# Patient Record
Sex: Female | Born: 1960
Health system: Southern US, Community
[De-identification: ages and names within clinical notes are randomized; demographics above are authoritative.]

## PROBLEM LIST (undated history)

## (undated) DIAGNOSIS — Z8679 Personal history of other diseases of the circulatory system: Secondary | ICD-10-CM

## (undated) DIAGNOSIS — I10 Essential (primary) hypertension: Secondary | ICD-10-CM

## (undated) DIAGNOSIS — E079 Disorder of thyroid, unspecified: Secondary | ICD-10-CM

## (undated) DIAGNOSIS — R001 Bradycardia, unspecified: Secondary | ICD-10-CM

## (undated) DIAGNOSIS — E785 Hyperlipidemia, unspecified: Secondary | ICD-10-CM

## (undated) DIAGNOSIS — K219 Gastro-esophageal reflux disease without esophagitis: Secondary | ICD-10-CM

---

## 2009-01-17 ENCOUNTER — Emergency Department (HOSPITAL_BASED_OUTPATIENT_CLINIC_OR_DEPARTMENT_OTHER): Admission: EM | Admit: 2009-01-17 | Discharge: 2009-01-17 | Payer: Self-pay | Admitting: Emergency Medicine

## 2010-10-02 ENCOUNTER — Encounter: Payer: Self-pay | Admitting: *Deleted

## 2010-10-02 ENCOUNTER — Emergency Department (INDEPENDENT_AMBULATORY_CARE_PROVIDER_SITE_OTHER): Payer: Medicaid Other

## 2010-10-02 ENCOUNTER — Emergency Department (HOSPITAL_BASED_OUTPATIENT_CLINIC_OR_DEPARTMENT_OTHER)
Admission: EM | Admit: 2010-10-02 | Discharge: 2010-10-02 | Disposition: A | Discharge status: Other Acute Inpatient Hospital | Payer: Medicaid Other | Attending: Emergency Medicine | Admitting: Emergency Medicine

## 2010-10-02 ENCOUNTER — Other Ambulatory Visit: Payer: Self-pay

## 2010-10-02 DIAGNOSIS — E079 Disorder of thyroid, unspecified: Secondary | ICD-10-CM | POA: Insufficient documentation

## 2010-10-02 DIAGNOSIS — R079 Chest pain, unspecified: Secondary | ICD-10-CM | POA: Insufficient documentation

## 2010-10-02 HISTORY — DX: Disorder of thyroid, unspecified: E07.9

## 2010-10-02 LAB — BASIC METABOLIC PANEL
CO2: 26 mEq/L (ref 19–32)
Glucose, Bld: 107 mg/dL — ABNORMAL HIGH (ref 70–99)
Potassium: 3.3 mEq/L — ABNORMAL LOW (ref 3.5–5.1)
Sodium: 141 mEq/L (ref 135–145)

## 2010-10-02 LAB — CBC
Hemoglobin: 11.1 g/dL — ABNORMAL LOW (ref 12.0–15.0)
MCH: 25.7 pg — ABNORMAL LOW (ref 26.0–34.0)
RBC: 4.32 MIL/uL (ref 3.87–5.11)

## 2010-10-02 MED ORDER — NITROGLYCERIN 2 % TD OINT
1.0000 [in_us] | TOPICAL_OINTMENT | Freq: Once | TRANSDERMAL | Status: AC
  Administered 2010-10-02: 1 [in_us] via TOPICAL
  Filled 2010-10-02: qty 1

## 2010-10-02 NOTE — ED Notes (Signed)
SR on monitor, resps even and unlabored, IV site unremarkable. Pt and family updated on plan of care.

## 2010-10-02 NOTE — ED Notes (Signed)
Pt c/o chest pain and tightness x 1 hour now without N/V. Pain is worse with movement.

## 2010-10-02 NOTE — ED Notes (Signed)
Spoke at length with pt's daughter regarding plan of care, transfer options, and delays. Family and pt continue to agree with current plan of care. Remains SR/SB on monitor, denies CP or SOB. IV site unremarkable.

## 2010-10-02 NOTE — ED Notes (Signed)
pts iv actually remains intact at time of transport however due to charting system must chart removal to avoid the appearance of the LDA remaining at next patients visit

## 2010-10-02 NOTE — ED Notes (Signed)
Call placed to [facility] for pt admission, no beds available at this time, pt will be placed on a hold list. MD aware.

## 2010-10-02 NOTE — ED Notes (Signed)
Report given to emily watkins rn with [facility] states will be here in 10-15 min to transport pt to [facility] room 423

## 2010-10-02 NOTE — ED Notes (Signed)
Report given to rn in icu at [facility]

## 2010-10-02 NOTE — ED Provider Notes (Signed)
History     CSN: [id number] Arrival date & time: 10/02/2010  2:57 AM  Chief Complaint  Patient presents with  . Chest Pain   Patient is a 50 y.o. female presenting with chest pain.  Chest Pain The chest pain began 1 - 2 hours ago. Duration of episode(s) is 2 hours. Chest pain occurs constantly. The chest pain is resolved. Associated with: nothing. The pain is currently at 0/10. The severity of the pain is moderate. The quality of the pain is described as pressure-like. The pain does not radiate. Chest pain is worsened by certain positions. Pertinent negatives for primary symptoms include no fever, no syncope, no shortness of breath, no cough, no wheezing, no abdominal pain and no nausea.  Pertinent negatives for associated symptoms include no diaphoresis. She tried nitroglycerin (resolved pain in route with EMS) for the symptoms. Risk factors include no known risk factors (FA had MI in his 40s).  Her family medical history is significant for CAD in family.    L sided pain - had ASA PTA  Past Medical History  Diagnosis Date  . Thyroid disease     History reviewed. No pertinent past surgical history.  History reviewed. No pertinent family history.  History  Substance Use Topics  . Smoking status: Not on file  . Smokeless tobacco: Not on file  . Alcohol Use: No    OB History    Grav Para Term Preterm Abortions TAB SAB Ect Mult Living                  Review of Systems  Constitutional: Negative for fever, chills and diaphoresis.  HENT: Negative for neck pain and neck stiffness.   Eyes: Negative for pain.  Respiratory: Negative for cough, shortness of breath and wheezing.   Cardiovascular: Positive for chest pain. Negative for syncope.  Gastrointestinal: Negative for nausea and abdominal pain.  Genitourinary: Negative for dysuria.  Musculoskeletal: Negative for back pain.  Skin: Negative for rash.  Neurological: Negative for headaches.  All other systems reviewed and are  negative.    Physical Exam  BP 153/88  Pulse 59  Temp(Src) 98.2 F (36.8 C) (Oral)  Resp 16  SpO2 100%  Physical Exam  Constitutional: She is oriented to person, place, and time. She appears well-developed and well-nourished.  HENT:  Head: Normocephalic and atraumatic.  Eyes: Conjunctivae and EOM are normal. Pupils are equal, round, and reactive to light.  Neck: Trachea normal. Neck supple. No thyromegaly present.  Cardiovascular: Normal rate, regular rhythm, S1 normal, S2 normal and normal pulses.     No systolic murmur is present   No diastolic murmur is present  Pulses:      Radial pulses are 2+ on the right side, and 2+ on the left side.  Pulmonary/Chest: Effort normal and breath sounds normal. She has no wheezes. She has no rhonchi. She has no rales. She exhibits no tenderness.  Abdominal: Soft. Normal appearance and bowel sounds are normal. There is no tenderness. There is no CVA tenderness and negative Murphy's sign.  Musculoskeletal:       BLE:s Calves nontender, no cords or erythema, negative Homans sign  Neurological: She is alert and oriented to person, place, and time. She has normal strength. No cranial nerve deficit or sensory deficit. GCS eye subscore is 4. GCS verbal subscore is 5. GCS motor subscore is 6.  Skin: Skin is warm and dry. No rash noted. She is not diaphoretic.  Psychiatric: Her speech is normal.  Cooperative and appropriate    ED Course  Procedures   Date: 10/02/2010  Rate: 58  Rhythm: normal sinus rhythm  QRS Axis: normal  Intervals: normal  ST/T Wave abnormalities: nonspecific ST changes  Conduction Disutrbances:none  Narrative Interpretation:   Old EKG Reviewed: none available    MDM  CP low risk for ACS, no symptoms of DVT and doubt PE/ dissection. Screening ECG reviewed. NTG paste applied and CXR and labs obtained/ reviewed. remians pain free in ED. MED c/s for admit. D/w DR Pearson Grippe who accepts in Tx for admit at [facility] at 4:57  AM   Dg Chest 2 View  10/02/2010  *RADIOLOGY REPORT*  Clinical Data: Chest pain  CHEST - 2 VIEW  Comparison: None.  Findings: Mild central vascular congestion without overt edema. Heart size upper normal limits to mildly enlarged.  No pleural effusion or pneumothorax.  No focal consolidation.  No acute osseous abnormality.  IMPRESSION: Hear size upper normal to mildly enlarged. No focal consolidation.  Original Report Authenticated By: Waneta Martins, M.D.   Results for orders placed during the hospital encounter of 10/02/10 (from the past 24 hour(s))  CBC     Status: Abnormal   Collection Time   10/02/10  2:52 AM      Component Value Range   WBC 3.8 (*) 4.0 - 10.5 (K/uL)   RBC 4.32  3.87 - 5.11 (MIL/uL)   Hemoglobin 11.1 (*) 12.0 - 15.0 (g/dL)   HCT 21.3 (*) 08.6 - 46.0 (%)   MCV 77.3 (*) 78.0 - 100.0 (fL)   MCH 25.7 (*) 26.0 - 34.0 (pg)   MCHC 33.2  30.0 - 36.0 (g/dL)   RDW 57.8  46.9 - 62.9 (%)   Platelets 293  150 - 400 (K/uL)  BASIC METABOLIC PANEL     Status: Abnormal   Collection Time   10/02/10  2:52 AM      Component Value Range   Sodium 141  135 - 145 (mEq/L)   Potassium 3.3 (*) 3.5 - 5.1 (mEq/L)   Chloride 105  96 - 112 (mEq/L)   CO2 26  19 - 32 (mEq/L)   Glucose, Bld 107 (*) 70 - 99 (mg/dL)   BUN 13  6 - 23 (mg/dL)   Creatinine, Ser 5.28  0.50 - 1.10 (mg/dL)   Calcium 8.8  8.4 - 41.3 (mg/dL)   GFR calc non Af Amer >60  >60 (mL/min)   GFR calc Af Amer >60  >60 (mL/min)  CARDIAC PANEL(CRET KIN+CKTOT+MB+TROPI)     Status: Abnormal   Collection Time   10/02/10  2:57 AM      Component Value Range   Total CK 672 (*) 7 - 177 (U/L)   CK, MB 8.2 (*) 0.3 - 4.0 (ng/mL)   Troponin I <0.30  <0.30 (ng/mL)   Relative Index 1.2  0.0 - 2.5          Sunnie Nielsen, MD 10/02/10 [phone]

## 2010-10-02 NOTE — ED Notes (Signed)
MD at bedside. 

## 2010-10-02 NOTE — ED Notes (Signed)
Pt c/o chest tightness that was keeping her from going to sleep. Pain was worse with movement, denies any N/V, no SOB, no cold or cough. Pt denies any diaphoresis. Pt is pain free at this time.

## 2010-10-02 NOTE — ED Notes (Signed)
Pt informed that she had been accepted at [facility] and was still waiting for bed assignment. Pt agrees with plan of care.

## 2010-10-16 LAB — CARDIAC PANEL(CRET KIN+CKTOT+MB+TROPI)
CK, MB: 8.2 ng/mL (ref 0.3–4.0)
Relative Index: 1.2 (ref 0.0–2.5)
Troponin I: <0.3 ng/mL (ref <0.30)

## 2013-09-03 ENCOUNTER — Inpatient Hospital Stay (HOSPITAL_BASED_OUTPATIENT_CLINIC_OR_DEPARTMENT_OTHER)
Admission: EM | Admit: 2013-09-03 | Discharge: 2013-09-04 | DRG: 392 | Disposition: A | Discharge status: Home / Self Care | Payer: Medicaid Other | Attending: Internal Medicine | Admitting: Internal Medicine

## 2013-09-03 ENCOUNTER — Emergency Department (HOSPITAL_BASED_OUTPATIENT_CLINIC_OR_DEPARTMENT_OTHER): Payer: Medicaid Other

## 2013-09-03 ENCOUNTER — Encounter (HOSPITAL_BASED_OUTPATIENT_CLINIC_OR_DEPARTMENT_OTHER): Payer: Self-pay | Admitting: Emergency Medicine

## 2013-09-03 DIAGNOSIS — I10 Essential (primary) hypertension: Secondary | ICD-10-CM | POA: Diagnosis present

## 2013-09-03 DIAGNOSIS — K219 Gastro-esophageal reflux disease without esophagitis: Principal | ICD-10-CM | POA: Diagnosis present

## 2013-09-03 DIAGNOSIS — E876 Hypokalemia: Secondary | ICD-10-CM | POA: Diagnosis present

## 2013-09-03 DIAGNOSIS — R001 Bradycardia, unspecified: Secondary | ICD-10-CM | POA: Diagnosis present

## 2013-09-03 DIAGNOSIS — I498 Other specified cardiac arrhythmias: Secondary | ICD-10-CM | POA: Diagnosis present

## 2013-09-03 DIAGNOSIS — I369 Nonrheumatic tricuspid valve disorder, unspecified: Secondary | ICD-10-CM

## 2013-09-03 DIAGNOSIS — R079 Chest pain, unspecified: Secondary | ICD-10-CM

## 2013-09-03 DIAGNOSIS — E785 Hyperlipidemia, unspecified: Secondary | ICD-10-CM | POA: Diagnosis present

## 2013-09-03 DIAGNOSIS — R0789 Other chest pain: Secondary | ICD-10-CM | POA: Diagnosis present

## 2013-09-03 DIAGNOSIS — E039 Hypothyroidism, unspecified: Secondary | ICD-10-CM | POA: Diagnosis present

## 2013-09-03 DIAGNOSIS — R0602 Shortness of breath: Secondary | ICD-10-CM | POA: Diagnosis present

## 2013-09-03 DIAGNOSIS — Z8679 Personal history of other diseases of the circulatory system: Secondary | ICD-10-CM

## 2013-09-03 DIAGNOSIS — Z8249 Family history of ischemic heart disease and other diseases of the circulatory system: Secondary | ICD-10-CM | POA: Diagnosis not present

## 2013-09-03 DIAGNOSIS — E079 Disorder of thyroid, unspecified: Secondary | ICD-10-CM | POA: Diagnosis present

## 2013-09-03 HISTORY — DX: Hyperlipidemia, unspecified: E78.5

## 2013-09-03 HISTORY — DX: Bradycardia, unspecified: R00.1

## 2013-09-03 HISTORY — DX: Personal history of other diseases of the circulatory system: Z86.79

## 2013-09-03 HISTORY — DX: Essential (primary) hypertension: I10

## 2013-09-03 HISTORY — DX: Gastro-esophageal reflux disease without esophagitis: K21.9

## 2013-09-03 LAB — D-DIMER, QUANTITATIVE: D-Dimer, Quant: <0.27 ug/mL-FEU (ref 0.00–0.48)

## 2013-09-03 LAB — CBC WITH DIFFERENTIAL/PLATELET
BASOS ABS: 0 10*3/uL (ref 0.0–0.1)
BASOS PCT: 0 % (ref 0–1)
EOS ABS: 0.1 10*3/uL (ref 0.0–0.7)
EOS PCT: 2 % (ref 0–5)
HEMATOCRIT: 35.9 % — AB (ref 36.0–46.0)
HEMOGLOBIN: 11.7 g/dL — AB (ref 12.0–15.0)
Lymphocytes Relative: 29 % (ref 12–46)
Lymphs Abs: 1.2 10*3/uL (ref 0.7–4.0)
MCH: 25.5 pg — AB (ref 26.0–34.0)
MCHC: 32.6 g/dL (ref 30.0–36.0)
MCV: 78.4 fL (ref 78.0–100.0)
MONO ABS: 0.4 10*3/uL (ref 0.1–1.0)
MONOS PCT: 10 % (ref 3–12)
NEUTROS ABS: 2.4 10*3/uL (ref 1.7–7.7)
Neutrophils Relative %: 59 % (ref 43–77)
Platelets: 293 10*3/uL (ref 150–400)
RBC: 4.58 MIL/uL (ref 3.87–5.11)
RDW: 14.8 % (ref 11.5–15.5)
WBC: 4 10*3/uL (ref 4.0–10.5)

## 2013-09-03 LAB — BASIC METABOLIC PANEL
Anion gap: 12 (ref 5–15)
BUN: 12 mg/dL (ref 6–23)
CALCIUM: 9.4 mg/dL (ref 8.4–10.5)
CHLORIDE: 105 meq/L (ref 96–112)
CO2: 26 mEq/L (ref 19–32)
CREATININE: 0.9 mg/dL (ref 0.50–1.10)
GFR calc non Af Amer: 71 mL/min — ABNORMAL LOW (ref >90)
GFR, EST AFRICAN AMERICAN: 82 mL/min — AB (ref >90)
Glucose, Bld: 109 mg/dL — ABNORMAL HIGH (ref 70–99)
Potassium: 3.6 mEq/L — ABNORMAL LOW (ref 3.7–5.3)
Sodium: 143 mEq/L (ref 137–147)

## 2013-09-03 LAB — TSH: TSH: 2.51 u[IU]/mL (ref 0.350–4.500)

## 2013-09-03 LAB — TROPONIN I
Troponin I: <0.3 ng/mL (ref <0.30)
Troponin I: <0.3 ng/mL (ref <0.30)

## 2013-09-03 LAB — LIPID PANEL
CHOLESTEROL: 218 mg/dL — AB (ref 0–200)
HDL: 68 mg/dL (ref >39)
LDL Cholesterol: 139 mg/dL — ABNORMAL HIGH (ref 0–99)
TRIGLYCERIDES: 54 mg/dL (ref <150)
Total CHOL/HDL Ratio: 3.2 RATIO
VLDL: 11 mg/dL (ref 0–40)

## 2013-09-03 LAB — HEMOGLOBIN A1C
Hgb A1c MFr Bld: 6.5 % — ABNORMAL HIGH (ref <5.7)
Mean Plasma Glucose: 140 mg/dL — ABNORMAL HIGH (ref <117)

## 2013-09-03 MED ORDER — PANTOPRAZOLE SODIUM 40 MG PO TBEC
40.0000 mg | DELAYED_RELEASE_TABLET | Freq: Every day | ORAL | Status: DC
Start: 2013-09-03 — End: 2013-09-04
  Administered 2013-09-03 – 2013-09-04 (×2): 40 mg via ORAL
  Filled 2013-09-03 (×2): qty 1

## 2013-09-03 MED ORDER — POTASSIUM CHLORIDE CRYS ER 20 MEQ PO TBCR
40.0000 meq | EXTENDED_RELEASE_TABLET | Freq: Once | ORAL | Status: AC
  Administered 2013-09-03: 40 meq via ORAL
  Filled 2013-09-03: qty 2

## 2013-09-03 MED ORDER — ACETAMINOPHEN 650 MG RE SUPP: 650.0000 mg | Freq: Four times a day (QID) | RECTAL | Status: DC | PRN

## 2013-09-03 MED ORDER — ASPIRIN 81 MG PO CHEW
324.0000 mg | CHEWABLE_TABLET | Freq: Once | ORAL | Status: AC
  Administered 2013-09-03: 324 mg via ORAL
  Filled 2013-09-03: qty 4

## 2013-09-03 MED ORDER — LEVOTHYROXINE SODIUM 150 MCG PO TABS
150.0000 ug | ORAL_TABLET | Freq: Every day | ORAL | Status: DC
  Filled 2013-09-03 (×2): qty 1

## 2013-09-03 MED ORDER — ENOXAPARIN SODIUM 40 MG/0.4ML ~~LOC~~ SOLN
40.0000 mg | SUBCUTANEOUS | Status: DC
  Filled 2013-09-03: qty 0.4

## 2013-09-03 MED ORDER — AMLODIPINE BESYLATE 5 MG PO TABS
5.0000 mg | ORAL_TABLET | Freq: Every day | ORAL | Status: DC
  Filled 2013-09-03: qty 1

## 2013-09-03 MED ORDER — ONDANSETRON HCL 4 MG PO TABS: 4.0000 mg | ORAL_TABLET | Freq: Four times a day (QID) | ORAL | Status: DC | PRN

## 2013-09-03 MED ORDER — ACETAMINOPHEN 325 MG PO TABS: 650.0000 mg | ORAL_TABLET | Freq: Four times a day (QID) | ORAL | Status: DC | PRN

## 2013-09-03 MED ORDER — PANTOPRAZOLE SODIUM 40 MG PO TBEC: 40.0000 mg | DELAYED_RELEASE_TABLET | Freq: Every day | ORAL | Status: DC

## 2013-09-03 MED ORDER — ENOXAPARIN SODIUM 40 MG/0.4ML ~~LOC~~ SOLN
40.0000 mg | Freq: Every day | SUBCUTANEOUS | Status: DC
  Administered 2013-09-03: 40 mg via SUBCUTANEOUS
  Filled 2013-09-03 (×2): qty 0.4

## 2013-09-03 MED ORDER — ASPIRIN 81 MG PO CHEW
324.0000 mg | CHEWABLE_TABLET | Freq: Every day | ORAL | Status: DC
  Administered 2013-09-04: 324 mg via ORAL
  Filled 2013-09-03: qty 4

## 2013-09-03 MED ORDER — AMLODIPINE BESYLATE 5 MG PO TABS
5.0000 mg | ORAL_TABLET | Freq: Every day | ORAL | Status: DC
Start: 2013-09-03 — End: 2013-09-04
  Administered 2013-09-03: 5 mg via ORAL
  Filled 2013-09-03 (×2): qty 1

## 2013-09-03 MED ORDER — ONDANSETRON HCL 4 MG/2ML IJ SOLN: 4.0000 mg | Freq: Four times a day (QID) | INTRAMUSCULAR | Status: DC | PRN

## 2013-09-03 MED ORDER — LEVOTHYROXINE SODIUM 150 MCG PO TABS
150.0000 ug | ORAL_TABLET | Freq: Every day | ORAL | Status: DC
  Administered 2013-09-03: 150 ug via ORAL
  Filled 2013-09-03 (×2): qty 1

## 2013-09-03 NOTE — Progress Notes (Signed)
UR Completed Farmer Mccahill Graves-Bigelow, RN,BSN [phone]  

## 2013-09-03 NOTE — ED Notes (Signed)
Patient states she developed heart burn yesterday around 1630, which became a sharp pain with coughing.  States this lasted on and off for approximately two hours.  States she went to work and had no additional pain.  States at 0700 this morning she got off of work and went outside to work in the yard.  States she had been working approximately 45 minutes the pain returned, felt like heartburn, then progressed to worse pain and was associated with sob.

## 2013-09-03 NOTE — Progress Notes (Signed)
  Echocardiogram 2D Echocardiogram has been performed.  Miranda Washington, Miranda Washington 09/03/2013, 4:25 PM

## 2013-09-03 NOTE — H&P (Addendum)
[facility] History and Physical  Miranda Washington ZOX:[id number] DOB: December 11, 1960 DOA: 09/03/2013  Referring physician: EDP PCP: Malena Peer, MD   Chief Complaint: chest pain  HPI: Miranda Washington is a 53 y.o. female with PMH of HTN, h/o dyslipidemia, family h/o CAD in father @ 48 and brother in 46s presents to the ER with the above complaint. She reports noticing substernal chest pain last night, which lasted for a few min then resolved, then went to work, this morning was working in her yard, 20 min thereafter she noticed chest pain again which was more intense than usual heartburn and had some shortness of breath which then resolved. She presented to the ER at [facility] where he was noted to have flat T waves in inferior leads, on repeat EKG this resolved   Review of Systems:  Constitutional:  No weight loss, night sweats, Fevers, chills, fatigue.  HEENT:  No headaches, Difficulty swallowing,Tooth/dental problems,Sore throat,  No sneezing, itching, ear ache, nasal congestion, post nasal drip,  Cardio-vascular:  No chest pain, Orthopnea, PND, swelling in lower extremities, anasarca, dizziness, palpitations  GI:  No heartburn, indigestion, abdominal pain, nausea, vomiting, diarrhea, change in bowel habits, loss of appetite  Resp:  No shortness of breath with exertion or at rest. No excess mucus, no productive cough, No non-productive cough, No coughing up of blood.No change in color of mucus.No wheezing.No chest wall deformity  Skin:  no rash or lesions.  GU:  no dysuria, change in color of urine, no urgency or frequency. No flank pain.  Musculoskeletal:  No joint pain or swelling. No decreased range of motion. No back pain.  Psych:  No change in mood or affect. No depression or anxiety. No memory loss.   Past Medical History  Diagnosis Date  . Thyroid disease   . Hypertension   . History of irregular heartbeat    History reviewed. No pertinent past surgical  history. Social History:  reports that she has never smoked. She has never used smokeless tobacco. She reports that she does not drink alcohol or use illicit drugs.  No Known Allergies  No family history on file.   Prior to Admission medications   Medication Sig Start Date End Date Taking? Authorizing Provider  amLODipine (NORVASC) 5 MG tablet Take 5 mg by mouth daily.   Yes Historical Provider, MD  levothyroxine (SYNTHROID, LEVOTHROID) 100 MCG tablet Take 150 mcg by mouth daily.    Yes Historical Provider, MD  nitroGLYCERIN (NITROSTAT) 0.4 MG SL tablet Place 0.4 mg under the tongue every 5 (five) minutes as needed for chest pain.   Yes Historical Provider, MD  nitroGLYCERIN (NITROSTAT) 0.3 MG SL tablet Place 0.3 mg under the tongue every 5 (five) minutes as needed.      Historical Provider, MD   Physical Exam: Filed Vitals:   09/03/13 0924 09/03/13 1130 09/03/13 1206 09/03/13 1327  BP: 143/63 144/67 139/66 137/52  Pulse: 50 81 46 47  Temp: 98.2 F (36.8 C)  98.1 F (36.7 C) 98.5 F (36.9 C)  TempSrc: Oral  Oral Oral  Resp: 18 18 14 18   Height: 5\' 2"  (1.575 m)     Weight: 70.761 kg (156 lb)     SpO2: 97% 96% 100% 98%    Wt Readings from Last 3 Encounters:  09/03/13 70.761 kg (156 lb)    General:  Appears calm and comfortable, AAOx3 Eyes: PERRL, normal lids, irises & conjunctiva ENT: grossly normal hearing, lips & tongue Neck: no LAD, masses  or thyromegaly Cardiovascular: RRR, no m/r/g. No LE edema. Respiratory: CTA bilaterally, no w/r/r. Normal respiratory effort. Abdomen: soft, NT, ND, BS present Skin: no rash or induration seen on limited exam Musculoskeletal: grossly normal tone BUE/BLE Psychiatric: grossly normal mood and affect, speech fluent and appropriate Neurologic: grossly non-focal.          Labs on Admission:  Basic Metabolic Panel:  Recent Labs Lab 09/03/13 1015  NA 143  K 3.6*  CL 105  CO2 26  GLUCOSE 109*  BUN 12  CREATININE 0.90  CALCIUM  9.4   Liver Function Tests: No results found for this basename: AST, ALT, ALKPHOS, BILITOT, PROT, ALBUMIN,  in the last 168 hours No results found for this basename: LIPASE, AMYLASE,  in the last 168 hours No results found for this basename: AMMONIA,  in the last 168 hours CBC:  Recent Labs Lab 09/03/13 1015  WBC 4.0  NEUTROABS 2.4  HGB 11.7*  HCT 35.9*  MCV 78.4  PLT 293   Cardiac Enzymes:  Recent Labs Lab 09/03/13 1015  TROPONINI <0.30    BNP (last 3 results) No results found for this basename: PROBNP,  in the last 8760 hours CBG: No results found for this basename: GLUCAP,  in the last 168 hours  Radiological Exams on Admission: Dg Chest 2 View  09/03/2013   CLINICAL DATA:  Mid chest pain 1 day.  EXAM: CHEST  2 VIEW  COMPARISON:  10/02/2010  FINDINGS: The heart size and mediastinal contours are within normal limits. Both lungs are clear. Minimal degenerative change of the spine.  IMPRESSION: No active cardiopulmonary disease.   Electronically Signed   By: Elberta Fortisaniel  Boyle M.D.   On: 09/03/2013 10:47    EKG: Independently reviewed. NSR, no acute ST T wave changes, early am EKG with Flat T waves in inf leads  Assessment/Plan Active Problems:   Chest pain -atypical, initial EKG with T wave flattening in inferior leads, repeat improved -risk factors for CAD-HTN, Dyslipidemia, family h/o premature CAD -suspect GERD vs musculoskeletal -Cycle cardiac enzymes -check ECHO -start PPI -Cards consult   HTN -continue amlodipine  Code Status: Full Code DVT Prophylaxis: lovenox Family Communication: none at bedside Disposition Plan: home pending workup  Time spent: 60min  [facility] HospitalJOSEPH,Frieda Arnall [facility] Pager [phone]  **Disclaimer: This note may have been dictated with voice recognition software. Similar sounding words can inadvertently be transcribed and this note may contain transcription errors which may not have been corrected upon publication of note.**

## 2013-09-03 NOTE — ED Provider Notes (Signed)
CSN: [id number]     Arrival date & time 09/03/13  8657 History   First MD Initiated Contact with Patient 09/03/13 0932     Chief Complaint  Patient presents with  . Chest Pain     (Consider location/radiation/quality/duration/timing/severity/associated sxs/prior Treatment) HPI Comments: Patient presents with chest pain. She states she had a short episode of chest pain last night when she coughed. This went away and this morning she was working on the yard and after she was working on the yard for about 45 minutes, she started having the pain again. She describes as a burning aching pain to the left side of her chest. It is nonradiating. She did have some associated shortness of breath but no nausea vomiting or diaphoresis. She states the pain lasted about an hour this morning and then went away. She denies any dizziness. She denies any history of heart problems in the past other than she was told she has irregular heartbeat. She has not had a stress test and has not followed by a cardiologist. She has a history of hypertension and diet controlled hyperlipidemia. There is no tobacco history.  Patient is a 53 y.o. female presenting with chest pain.  Chest Pain Associated symptoms: shortness of breath   Associated symptoms: no abdominal pain, no back pain, no cough, no diaphoresis, no dizziness, no fatigue, no fever, no headache, no nausea, no numbness, not vomiting and no weakness     Past Medical History  Diagnosis Date  . Thyroid disease   . Hypertension   . History of irregular heartbeat    History reviewed. No pertinent past surgical history. No family history on file. History  Substance Use Topics  . Smoking status: Never Smoker   . Smokeless tobacco: Never Used  . Alcohol Use: No   OB History   Grav Para Term Preterm Abortions TAB SAB Ect Mult Living                 Review of Systems  Constitutional: Negative for fever, chills, diaphoresis and fatigue.  HENT: Negative for  congestion, rhinorrhea and sneezing.   Eyes: Negative.   Respiratory: Positive for shortness of breath. Negative for cough and chest tightness.   Cardiovascular: Positive for chest pain. Negative for leg swelling.  Gastrointestinal: Negative for nausea, vomiting, abdominal pain, diarrhea and blood in stool.  Genitourinary: Negative for frequency, hematuria, flank pain and difficulty urinating.  Musculoskeletal: Negative for arthralgias and back pain.  Skin: Negative for rash.  Neurological: Negative for dizziness, speech difficulty, weakness, numbness and headaches.      Allergies  Review of patient's allergies indicates no known allergies.  Home Medications   Prior to Admission medications   Medication Sig Start Date End Date Taking? Authorizing Provider  amLODipine (NORVASC) 5 MG tablet Take 5 mg by mouth daily.   Yes Historical Provider, MD  levothyroxine (SYNTHROID, LEVOTHROID) 100 MCG tablet Take 150 mcg by mouth daily.    Yes Historical Provider, MD  nitroGLYCERIN (NITROSTAT) 0.3 MG SL tablet Place 0.3 mg under the tongue every 5 (five) minutes as needed.      Historical Provider, MD   BP 143/63  Pulse 50  Temp(Src) 98.2 F (36.8 C) (Oral)  Resp 18  Ht 5\' 2"  (1.575 m)  Wt 156 lb (70.761 kg)  BMI 28.53 kg/m2  SpO2 97% Physical Exam  Constitutional: She is oriented to person, place, and time. She appears well-developed and well-nourished.  HENT:  Head: Normocephalic and atraumatic.  Eyes:  Pupils are equal, round, and reactive to light.  Neck: Normal range of motion. Neck supple.  Cardiovascular: Normal rate, regular rhythm and normal heart sounds.   Pulmonary/Chest: Effort normal and breath sounds normal. No respiratory distress. She has no wheezes. She has no rales. She exhibits no tenderness.  Abdominal: Soft. Bowel sounds are normal. There is no tenderness. There is no rebound and no guarding.  Musculoskeletal: Normal range of motion. She exhibits no edema.  No calf  tenderness  Lymphadenopathy:    She has no cervical adenopathy.  Neurological: She is alert and oriented to person, place, and time.  Skin: Skin is warm and dry. No rash noted.  Psychiatric: She has a normal mood and affect.    ED Course  Procedures (including critical care time) Labs Review Results for orders placed during the hospital encounter of 09/03/13  CBC WITH DIFFERENTIAL      Result Value Ref Range   WBC 4.0  4.0 - 10.5 K/uL   RBC 4.58  3.87 - 5.11 MIL/uL   Hemoglobin 11.7 (*) 12.0 - 15.0 g/dL   HCT 16.1 (*) 09.6 - 04.5 %   MCV 78.4  78.0 - 100.0 fL   MCH 25.5 (*) 26.0 - 34.0 pg   MCHC 32.6  30.0 - 36.0 g/dL   RDW 40.9  81.1 - 91.4 %   Platelets 293  150 - 400 K/uL   Neutrophils Relative % 59  43 - 77 %   Neutro Abs 2.4  1.7 - 7.7 K/uL   Lymphocytes Relative 29  12 - 46 %   Lymphs Abs 1.2  0.7 - 4.0 K/uL   Monocytes Relative 10  3 - 12 %   Monocytes Absolute 0.4  0.1 - 1.0 K/uL   Eosinophils Relative 2  0 - 5 %   Eosinophils Absolute 0.1  0.0 - 0.7 K/uL   Basophils Relative 0  0 - 1 %   Basophils Absolute 0.0  0.0 - 0.1 K/uL  BASIC METABOLIC PANEL      Result Value Ref Range   Sodium 143  137 - 147 mEq/L   Potassium 3.6 (*) 3.7 - 5.3 mEq/L   Chloride 105  96 - 112 mEq/L   CO2 26  19 - 32 mEq/L   Glucose, Bld 109 (*) 70 - 99 mg/dL   BUN 12  6 - 23 mg/dL   Creatinine, Ser 7.82  0.50 - 1.10 mg/dL   Calcium 9.4  8.4 - 95.6 mg/dL   GFR calc non Af Amer 71 (*) >90 mL/min   GFR calc Af Amer 82 (*) >90 mL/min   Anion gap 12  5 - 15  TROPONIN I      Result Value Ref Range   Troponin I <0.30  <0.30 ng/mL  D-DIMER, QUANTITATIVE      Result Value Ref Range   D-Dimer, Quant <0.27  0.00 - 0.48 ug/mL-FEU   Dg Chest 2 View  09/03/2013   CLINICAL DATA:  Mid chest pain 1 day.  EXAM: CHEST  2 VIEW  COMPARISON:  10/02/2010  FINDINGS: The heart size and mediastinal contours are within normal limits. Both lungs are clear. Minimal degenerative change of the spine.   IMPRESSION: No active cardiopulmonary disease.   Electronically Signed   By: Elberta Fortis M.D.   On: 09/03/2013 10:47     Imaging Review Dg Chest 2 View  09/03/2013   CLINICAL DATA:  Mid chest pain 1 day.  EXAM: CHEST  2 VIEW  COMPARISON:  10/02/2010  FINDINGS: The heart size and mediastinal contours are within normal limits. Both lungs are clear. Minimal degenerative change of the spine.  IMPRESSION: No active cardiopulmonary disease.   Electronically Signed   By: Elberta Fortisaniel  Boyle M.D.   On: 09/03/2013 10:47     EKG Interpretation   Date/Time:  Friday September 03 2013 09:26:36 EDT Ventricular Rate:  48 PR Interval:  166 QRS Duration: 76 QT Interval:  406 QTC Calculation: 362 R Axis:   20 Text Interpretation:  Sinus bradycardia Cannot rule out Anterior infarct ,  age undetermined Abnormal ECG increased T wave flattening anteriorly  Confirmed by Shaleta Ruacho  MD, Kirin Brandenburger ([id number]) on 09/03/2013 10:00:40 AM      MDM   Final diagnoses:  Chest pain, unspecified chest pain type    Pt presents with chest pain, exertional today.  No discomfort now.  EKG ok, troponin negative.  Pt given ASA.  Has a HEART score of 4, moderate risk.  [facility]    Rolan BuccoMelanie Tomio Kirk, MD 09/03/13 1106

## 2013-09-03 NOTE — Consult Note (Signed)
CARDIOLOGY CONSULT NOTE   Patient ID: Miranda Washington MRN: [id number] DOB/AGE: 1960-02-12 53 y.o.  Admit date: 09/03/2013  Primary Physician   Malena Peer, MD Primary Cardiologist   New Reason for Consultation   Chest pain   HPI: Miranda Washington is a 53 y.o. female with a history of GERD, HTN, HLD, hypothyroidism, family history of CAD and no personal prior cardiac history who presented to [facility] today with chest pain this morning. [facility] [place] [facility] where she was noted to have flat T waves in inferior leads, on repeat EKG this resolved. She was transferred here for further care.  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 While she was pulling weeds when she had sudden onset of substernal "pulling " and heartburn-like sensation. The pain was 6/10 at its worst and resolved within 20 minutes on its own. She reports associated mild shortness of breath. She can reproduce the tightness with deep inspiration. She denies radiation, palpitations, diaphoresis, nausea, vomiting or exertional chest pain or shortness of breath. She denies lower extremity swelling, orthopnea, PND. She is a lifetime non-smoker and occasionally drinks alcohol. She has had one episode similar to this in 2012 and was admitted overnight and let go with noncardiac chest pain the following morning. She has a family history of CAD in father @ 64 and brother in 52s. She also has a history of some arrhythmia although the patient cannot tell me what this is. Telemetry reveals intermittent normal sinus rhythm with periods of bradycardia in the 30s and 40s.   Past Medical History  Diagnosis Date  . Thyroid disease   . Hypertension   . History of irregular heartbeat   . HLD (hyperlipidemia)   . Bradycardia   . GERD (gastroesophageal reflux disease)      History reviewed. No pertinent past surgical history.  No Known  Allergies  I have reviewed the patient's current medications   acetaminophen, acetaminophen, ondansetron (ZOFRAN) IV, ondansetron  Prior to Admission medications   Medication Sig Start Date End Date Taking? Authorizing Provider  amLODipine (NORVASC) 5 MG tablet Take 5 mg by mouth daily.   Yes Historical Provider, MD  levothyroxine (SYNTHROID, LEVOTHROID) 100 MCG tablet Take 150 mcg by mouth daily.    Yes Historical Provider, MD  nitroGLYCERIN (NITROSTAT) 0.4 MG SL tablet Place 0.4 mg under the tongue every 5 (five) minutes as needed for chest pain.   Yes Historical Provider, MD  nitroGLYCERIN (NITROSTAT) 0.3 MG SL tablet Place 0.3 mg under the tongue every 5 (five) minutes as needed.      Historical Provider, MD     History   Social History  . Marital Status: Single    Spouse Name: N/A    Number of Children: N/A  . Years of Education: N/A   Occupational History  . Not on file.   Social History Main Topics  . Smoking status: Never Smoker   . Smokeless tobacco: Never Used  . Alcohol Use: No  . Drug Use: No  . Sexual Activity: Not on file   Other Topics Concern  . Not on file   Social History Narrative  . No narrative on file    No family status information on file.   No family history on file.   ROS:  Full 14 point review of systems complete and found to be negative unless listed above.  Physical Exam: Blood pressure 137/52, pulse 47, temperature 98.5 F (36.9 C), temperature source Oral, resp. rate 18, height 5\' 2"  (1.575 m), weight 156 lb (70.761 kg), SpO2 98.00%.  General: Well developed, well nourished, female in no acute distress Head: Eyes PERRLA, No xanthomas.   Normocephalic and atraumatic, oropharynx without edema or exudate.   Lungs: CTAB Heart: HRRR S1 S2, no rub/gallop, Heart irregular rate and rhythm with S1, S2  murmur. pulses are 2+ extrem.   Neck: No carotid bruits. No lymphadenopathy.  No JVD. Abdomen: Bowel sounds present, abdomen soft and  non-tender without masses or hernias noted. Msk:  No spine or cva tenderness. No weakness, no joint deformities or effusions. Extremities: No clubbing or cyanosis.  edema.  Neuro: Alert and oriented X 3. No focal deficits noted. Psych:  Good affect, responds appropriately Skin: No rashes or lesions noted.  Labs:   Lab Results  Component Value Date   WBC 4.0 09/03/2013   HGB 11.7* 09/03/2013   HCT 35.9* 09/03/2013   MCV 78.4 09/03/2013   PLT 293 09/03/2013      Recent Labs Lab 09/03/13 1015  NA 143  K 3.6*  CL 105  CO2 26  BUN 12  CREATININE 0.90  CALCIUM 9.4  GLUCOSE 109*    Recent Labs  09/03/13 1015  TROPONINI <0.30    Lab Results  Component Value Date   DDIMER <0.27 09/03/2013    Echo: pending  ECG:  Sinus brady HR 47  Radiology:  Dg Chest 2 View  09/03/2013   CLINICAL DATA:  Mid chest pain 1 day.  EXAM: CHEST  2 VIEW  COMPARISON:  10/02/2010  FINDINGS: The heart size and mediastinal contours are within normal limits. Both lungs are clear. Minimal degenerative change of the spine.  IMPRESSION: No active cardiopulmonary disease.   Electronically Signed   By: Elberta Fortisaniel  Boyle M.D.   On: 09/03/2013 10:47    ASSESSMENT AND PLAN:    Active Problems:   Chest pain   HTN (hypertension)   Dyslipidemia   GERD (gastroesophageal reflux disease)   Bradycardia   HLD (hyperlipidemia)   History of irregular heartbeat   Hypertension   Thyroid disease   Miranda Washington is a 53 y.o. female with a history of GERD, HTN, HLD, hypothyroidism, family history of CAD and no personal prior cardiac history who presented to [facility] today with chest pain.   Chest pain  -- Atypical, initial EKG with T wave flattening in inferior leads, repeat improved. She does have risk factors for CAD including HTN and HLD as well as a strong family history with brother with CAD/stent placement in 7340s. -- Troponin neg x1. ECG w/ no ST or TW changes. -- D-dimer negative.  -- Cycle cardiac enzymes  --  Check ECHO  -- Started on PPI -- Will plan for exercise myoview in the AM. NPO after midnight.   Hypokalemia- mildly low. Will supplement one time. BMET tomorrow.   HTN- continue amlodipine.    Hypothyroidism- continue synthroid. Check TSH    Bradycardia/ atrial tachycardia- has a history of this. Telemetry reveals intermittent normal sinus rhythm with periods of bradycardia in the 30s and 40s. No lightheadedness or syncope.   HLD- lipid panel for further risk stratification.     SignedThereasa Parkin: STERN, KATHRYN, PA-C 09/03/2013 3:02 PM  Pager [phone]  Co-Sign MD  I have examined the patient and reviewed assessment and plan and discussed with patient.  Agree with above as stated.  She had some  Twave flattening on ECG which was dynamic.  Wandering atrial pacemaker on tele.  Chest pain is atypical now.  Brother had a stent in his 41s.  Plan for exercise nuclear stress test to evaluate for ischemia if she rules out. She is agreeable.  VARANASI,JAYADEEP S.

## 2013-09-04 ENCOUNTER — Inpatient Hospital Stay (HOSPITAL_COMMUNITY): Payer: Medicaid Other

## 2013-09-04 DIAGNOSIS — K219 Gastro-esophageal reflux disease without esophagitis: Principal | ICD-10-CM

## 2013-09-04 DIAGNOSIS — R079 Chest pain, unspecified: Secondary | ICD-10-CM

## 2013-09-04 LAB — BASIC METABOLIC PANEL
ANION GAP: 13 (ref 5–15)
BUN: 12 mg/dL (ref 6–23)
CO2: 24 mEq/L (ref 19–32)
Calcium: 8.6 mg/dL (ref 8.4–10.5)
Chloride: 104 mEq/L (ref 96–112)
Creatinine, Ser: 0.7 mg/dL (ref 0.50–1.10)
Glucose, Bld: 113 mg/dL — ABNORMAL HIGH (ref 70–99)
POTASSIUM: 3.6 meq/L — AB (ref 3.7–5.3)
SODIUM: 141 meq/L (ref 137–147)

## 2013-09-04 LAB — CBC
HCT: 36.2 % (ref 36.0–46.0)
Hemoglobin: 11.5 g/dL — ABNORMAL LOW (ref 12.0–15.0)
MCH: 24.8 pg — ABNORMAL LOW (ref 26.0–34.0)
MCHC: 31.8 g/dL (ref 30.0–36.0)
MCV: 78.2 fL (ref 78.0–100.0)
PLATELETS: 280 10*3/uL (ref 150–400)
RBC: 4.63 MIL/uL (ref 3.87–5.11)
RDW: 14.8 % (ref 11.5–15.5)
WBC: 3.5 10*3/uL — ABNORMAL LOW (ref 4.0–10.5)

## 2013-09-04 LAB — TROPONIN I: Troponin I: <0.3 ng/mL (ref <0.30)

## 2013-09-04 MED ORDER — TECHNETIUM TC 99M SESTAMIBI - CARDIOLITE
10.0000 | Freq: Once | INTRAVENOUS | Status: AC | PRN
  Administered 2013-09-04: 09:00:00 10 via INTRAVENOUS

## 2013-09-04 MED ORDER — ATORVASTATIN CALCIUM 10 MG PO TABS
10.0000 mg | ORAL_TABLET | Freq: Every day | ORAL | Status: DC
  Administered 2013-09-04: 10 mg via ORAL
  Filled 2013-09-04: qty 1

## 2013-09-04 MED ORDER — PANTOPRAZOLE SODIUM 40 MG PO TBEC
40.0000 mg | DELAYED_RELEASE_TABLET | Freq: Every day | ORAL | Status: DC
Start: 2013-09-04 — End: 2015-04-02

## 2013-09-04 MED ORDER — TECHNETIUM TC 99M SESTAMIBI - CARDIOLITE
30.0000 | Freq: Once | INTRAVENOUS | Status: AC | PRN
  Administered 2013-09-04: 30 via INTRAVENOUS

## 2013-09-04 MED ORDER — ATORVASTATIN CALCIUM 10 MG PO TABS: 10.0000 mg | ORAL_TABLET | Freq: Every day | ORAL | Status: DC

## 2013-09-04 MED ORDER — LORATADINE 10 MG PO TABS: 10.0000 mg | ORAL_TABLET | Freq: Every day | ORAL | Status: DC | PRN

## 2013-09-04 NOTE — Progress Notes (Signed)
Patient ID: Miranda Washington  female  RSW:[id number]    DOB: 1960/10/20    DOA: 09/03/2013  PCP: Malena Peer, MD  Assessment/Plan: Principal Problem:   Chest pain: Atypical features however history of hypertension, dyslipidemia, family history of coronary artery disease - Ruled out for acute ACS, had EKG changes on admission with T wave flattening in inferior leads. Cardiology consulted and recommended nuclear medicine stress test today. - TSH 2.5, d-dimer normal less than 0.27 - 2-D echo showed EF of 55-60%, normal wall motion, mildly calcified aortic annulus  Active Problems:   Dyslipidemia - Lipid panel showed LDL of 139 - Added Lipitor 10 mg daily    GERD (gastroesophageal reflux disease) - Continue PPI    Hypertension - Stable    Thyroid disease - Continue Synthroid, TSH 2.5  DVT Prophylaxis: Lovenox  Code Status: Full code  Family Communication:  Disposition: DC home today if stress test is normal  Consultants:  Cardiology  Procedures:  2-D echo  Nuclear medicine stress test  Antibiotics:  None    Subjective: Patient seen and examined, chest pain has resolved, was having mild bronchitis in the last couple days  Objective: Weight change:   Intake/Output Summary (Last 24 hours) at 09/04/13 0956 Last data filed at 09/04/13 0900  Gross per 24 hour  Intake    480 ml  Output    700 ml  Net   -220 ml   Blood pressure 186/47, pulse 99, temperature 98.3 F (36.8 C), temperature source Oral, resp. rate 18, height 5\' 2"  (1.575 m), weight 74.299 kg (163 lb 12.8 oz), SpO2 98.00%.  Physical Exam: General: Alert and awake, oriented x3, not in any acute distress. CVS: S1-S2 clear, no murmur rubs or gallops Chest: clear to auscultation bilaterally, no wheezing, rales or rhonchi Abdomen: soft nontender, nondistended, normal bowel sounds  Extremities: no cyanosis, clubbing or edema noted bilaterally Neuro: Cranial nerves II-XII intact, no focal neurological  deficits  Lab Results: Basic Metabolic Panel:  Recent Labs Lab 09/03/13 1015 09/04/13 0415  NA 143 141  K 3.6* 3.6*  CL 105 104  CO2 26 24  GLUCOSE 109* 113*  BUN 12 12  CREATININE 0.90 0.70  CALCIUM 9.4 8.6   Liver Function Tests: No results found for this basename: AST, ALT, ALKPHOS, BILITOT, PROT, ALBUMIN,  in the last 168 hours No results found for this basename: LIPASE, AMYLASE,  in the last 168 hours No results found for this basename: AMMONIA,  in the last 168 hours CBC:  Recent Labs Lab 09/03/13 1015 09/04/13 0415  WBC 4.0 3.5*  NEUTROABS 2.4  --   HGB 11.7* 11.5*  HCT 35.9* 36.2  MCV 78.4 78.2  PLT 293 280   Cardiac Enzymes:  Recent Labs Lab 09/03/13 1530 09/03/13 2035 09/04/13 0415  TROPONINI <0.30 <0.30 <0.30   BNP: No components found with this basename: POCBNP,  CBG: No results found for this basename: GLUCAP,  in the last 168 hours   Micro Results: No results found for this or any previous visit (from the past 240 hour(s)).  Studies/Results: Dg Chest 2 View  09/03/2013   CLINICAL DATA:  Mid chest pain 1 day.  EXAM: CHEST  2 VIEW  COMPARISON:  10/02/2010  FINDINGS: The heart size and mediastinal contours are within normal limits. Both lungs are clear. Minimal degenerative change of the spine.  IMPRESSION: No active cardiopulmonary disease.   Electronically Signed   By: Elberta Fortis M.D.   On: 09/03/2013 10:47  Medications: Scheduled Meds: . amLODipine  5 mg Oral QHS  . aspirin  324 mg Oral Daily  . atorvastatin  10 mg Oral q1800  . enoxaparin (LOVENOX) injection  40 mg Subcutaneous QHS  . levothyroxine  150 mcg Oral QHS  . pantoprazole  40 mg Oral Q1200      LOS: 1 day   RAI,RIPUDEEP M.D. [facility] 09/04/2013, 9:56 AM Pager: [phone]  If 7PM-7AM, please contact night-coverage [email or web address] Password [email or web address]  **Disclaimer: This note was dictated with voice recognition software. Similar sounding words can inadvertently  be transcribed and this note may contain transcription errors which may not have been corrected upon publication of note.**

## 2013-09-04 NOTE — Progress Notes (Signed)
Patient Name: Miranda BullocksDonis Washington Date of Encounter: 09/04/2013     Principal Problem:   Chest pain Active Problems:   Dyslipidemia   GERD (gastroesophageal reflux disease)   Bradycardia   HLD (hyperlipidemia)   History of irregular heartbeat   Hypertension   Thyroid disease    SUBJECTIVE  No c/p or sob.  For MV today.  CURRENT MEDS . amLODipine  5 mg Oral QHS  . aspirin  324 mg Oral Daily  . enoxaparin (LOVENOX) injection  40 mg Subcutaneous QHS  . levothyroxine  150 mcg Oral QHS  . pantoprazole  40 mg Oral Q1200    OBJECTIVE  Filed Vitals:   09/03/13 2100 09/04/13 0500 09/04/13 0917 09/04/13 0941  BP: 126/47 141/69 146/89   Pulse: 50 44 48 91  Temp: 97.7 F (36.5 C) 98.3 F (36.8 C)    TempSrc:      Resp: 18 18    Height:      Weight:  163 lb 12.8 oz (74.299 kg)    SpO2: 98% 98%      Intake/Output Summary (Last 24 hours) at 09/04/13 0944 Last data filed at 09/04/13 0900  Gross per 24 hour  Intake    480 ml  Output    700 ml  Net   -220 ml   Filed Weights   09/03/13 0924 09/04/13 0500  Weight: 156 lb (70.761 kg) 163 lb 12.8 oz (74.299 kg)    PHYSICAL EXAM  General: Pleasant, NAD. Neuro: Alert and oriented X 3. Moves all extremities spontaneously. HEENT:  Normal  Neck: Supple without bruits or JVD. Lungs:  Resp regular and unlabored, CTA. Heart: RRR no s3, s4, or murmurs.  Extremities: No clubbing, cyanosis or edema. DP/PT/Radials 2+ and equal bilaterally.  Accessory Clinical Findings  CBC  Recent Labs  09/03/13 1015 09/04/13 0415  WBC 4.0 3.5*  NEUTROABS 2.4  --   HGB 11.7* 11.5*  HCT 35.9* 36.2  MCV 78.4 78.2  PLT 293 280   Basic Metabolic Panel  Recent Labs  09/03/13 1015 09/04/13 0415  NA 143 141  K 3.6* 3.6*  CL 105 104  CO2 26 24  GLUCOSE 109* 113*  BUN 12 12  CREATININE 0.90 0.70  CALCIUM 9.4 8.6   Cardiac Enzymes  Recent Labs  09/03/13 1530 09/03/13 2035 09/04/13 0415  TROPONINI <0.30 <0.30 <0.30    D-Dimer  Recent Labs  09/03/13 1015  DDIMER <0.27   Hemoglobin A1C  Recent Labs  09/03/13 1530  HGBA1C 6.5*   Fasting Lipid Panel  Recent Labs  09/03/13 1530  CHOL 218*  HDL 68  LDLCALC 139*  TRIG 54  CHOLHDL 3.2   Thyroid Function Tests  Recent Labs  09/03/13 1530  TSH 2.510    TELE  Seen in nuc med  Radiology/Studies  Dg Chest 2 View  09/03/2013   CLINICAL DATA:  Mid chest pain 1 day.  EXAM: CHEST  2 VIEW  COMPARISON:  10/02/2010  FINDINGS: The heart size and mediastinal contours are within normal limits. Both lungs are clear. Minimal degenerative change of the spine.  IMPRESSION: No active cardiopulmonary disease.   Electronically Signed   By: Elberta Fortisaniel  Boyle M.D.   On: 09/03/2013 10:47   2D Echocardiogram 7.31.2015  Study Conclusions  - Left ventricle: The cavity size was normal. Wall thickness was   normal. Systolic function was normal. The estimated ejection   fraction was in the range of 55% to 60%. Wall motion was normal;  there were no regional wall motion abnormalities. - Aortic valve: Mildly calcified annulus.  ASSESSMENT AND PLAN  1.  Atypical c/p:  No recurrence.  CE neg.  D dimer nl.  Echo showed nl LV fxn. Myocardial perfusion pending as of now.   Plan d/c if nl.  2.  HTN:  Stable.  3.  HL:  LDL 139.  10 yr risk calculates to 9.2%.  Will add lipitor 10.  4.  Hypothyroidism: TSH wnl.  Cont current dose of synthroid.  5.  Bradycardia:   Of note, normal chronotropic response with exercise (peak HR 153 @ 9 mins of exercise).  She had sinus bradycardia at night with some sinus arrhythmia.  I think this can be observed.

## 2013-09-04 NOTE — Discharge Summary (Signed)
Physician Discharge Summary  Patient ID: Miranda BullocksDonis Washington MRN: [id number] DOB/AGE: 55/03/1960 55 y.o.  Admit date: 09/03/2013 Discharge date: 09/04/2013  Primary Care Physician:  Malena PeerRAWFORD, ROBERT, MD  Discharge Diagnoses:    . Atypical Chest pain possibly GERD  . Dyslipidemia . GERD (gastroesophageal reflux disease) . Bradycardia . Hypertension . Thyroid disease  Consults: Cardiology   Recommendations for Outpatient Follow-up:  Please follow lipid panel out patient  Allergies:  No Known Allergies   Discharge Medications:   Medication List         amLODipine 5 MG tablet  Commonly known as:  NORVASC  Take 5 mg by mouth daily.     atorvastatin 10 MG tablet  Commonly known as:  LIPITOR  Take 1 tablet (10 mg total) by mouth at bedtime.     levothyroxine 100 MCG tablet  Commonly known as:  SYNTHROID, LEVOTHROID  Take 150 mcg by mouth daily.     loratadine 10 MG tablet  Commonly known as:  CLARITIN  Take 1 tablet (10 mg total) by mouth daily as needed for allergies.     nitroGLYCERIN 0.4 MG SL tablet  Commonly known as:  NITROSTAT  Place 0.4 mg under the tongue every 5 (five) minutes as needed for chest pain.     pantoprazole 40 MG tablet  Commonly known as:  PROTONIX  Take 1 tablet (40 mg total) by mouth daily. For heart burn/acid reflux         Brief H and P: For complete details please refer to admission H and P, but in brief Miranda Washington is a 53 y.o. female with PMH of HTN, h/o dyslipidemia, family h/o CAD in father at 9265 and brother in 2740s presents to the ER with the above complaint.  She reported noticing substernal chest pain the previous night, lasted for a few min then resolved, then went to work, on the morning of admission, was working in her yard, 20 min thereafter she noticed chest pain again which was more intense than usual heartburn and had some shortness of breath which then resolved.  [facility] where she was noted to  have flat T waves in inferior leads, on repeat EKG this resolved   Hospital Course:  Chest pain: Atypical features however history of hypertension, dyslipidemia, family history of coronary artery disease  Patient was ruled out for acute ACS, had EKG changes on admission with T wave flattening in inferior leads. Cardiology consulted and recommended nuclear medicine stress test. TSH 2.5, d-dimer normal less than 0.27  2-D echo showed EF of 55-60%, normal wall motion, mildly calcified aortic annulus  stress test showed small fixed defect in the left ventricle apex with no associated wall motion abnormality, likely artifact, EF 66%, negative for pharmacological stress-induced ischemia.  Dyslipidemia  Lipid panel showed LDL of 139, added Lipitor 10 mg daily   GERD (gastroesophageal reflux disease)  - Continue PPI   Hypertension  - Stable   Thyroid disease  - Continue Synthroid, TSH 2.5   Day of Discharge BP 186/47  Pulse 99  Temp(Src) 98.3 F (36.8 C) (Oral)  Resp 18  Ht 5\' 2"  (1.575 m)  Wt 74.299 kg (163 lb 12.8 oz)  BMI 29.95 kg/m2  SpO2 98%  Physical Exam: General: Alert and awake oriented x3 not in any acute distress. HEENT: anicteric sclera, pupils reactive to light and accommodation CVS: S1-S2 clear no murmur rubs or gallops Chest: clear to auscultation bilaterally, no wheezing rales  or rhonchi Abdomen: soft nontender, nondistended, normal bowel sounds Extremities: no cyanosis, clubbing or edema noted bilaterally Neuro: Cranial nerves II-XII intact, no focal neurological deficits   The results of significant diagnostics from this hospitalization (including imaging, microbiology, ancillary and laboratory) are listed below for reference.    LAB RESULTS: Basic Metabolic Panel:  Recent Labs Lab 09/03/13 1015 09/04/13 0415  NA 143 141  K 3.6* 3.6*  CL 105 104  CO2 26 24  GLUCOSE 109* 113*  BUN 12 12  CREATININE 0.90 0.70  CALCIUM 9.4 8.6   Liver Function  Tests: No results found for this basename: AST, ALT, ALKPHOS, BILITOT, PROT, ALBUMIN,  in the last 168 hours No results found for this basename: LIPASE, AMYLASE,  in the last 168 hours No results found for this basename: AMMONIA,  in the last 168 hours CBC:  Recent Labs Lab 09/03/13 1015 09/04/13 0415  WBC 4.0 3.5*  NEUTROABS 2.4  --   HGB 11.7* 11.5*  HCT 35.9* 36.2  MCV 78.4 78.2  PLT 293 280   Cardiac Enzymes:  Recent Labs Lab 09/03/13 2035 09/04/13 0415  TROPONINI <0.30 <0.30   BNP: No components found with this basename: POCBNP,  CBG: No results found for this basename: GLUCAP,  in the last 168 hours  Significant Diagnostic Studies:  Dg Chest 2 View  09/03/2013   CLINICAL DATA:  Mid chest pain 1 day.  EXAM: CHEST  2 VIEW  COMPARISON:  10/02/2010  FINDINGS: The heart size and mediastinal contours are within normal limits. Both lungs are clear. Minimal degenerative change of the spine.  IMPRESSION: No active cardiopulmonary disease.   Electronically Signed   By: Elberta Fortis M.D.   On: 09/03/2013 10:47    2D ECHO: Study Conclusions  - Left ventricle: The cavity size was normal. Wall thickness was normal. Systolic function was normal. The estimated ejection fraction was in the range of 55% to 60%. Wall motion was normal; there were no regional wall motion abnormalities. - Aortic valve: Mildly calcified annulus.    Disposition and Follow-up:    DISPOSITION: Home  DIET: Heart healthy     DISCHARGE FOLLOW-UP     Follow-up Information   Follow up with Malena Peer, MD. Schedule an appointment as soon as possible for a visit in 2 weeks. (for hospital follow-up)    Specialty:  Unknown Physician Specialty   Contact information:   [address] [place] [phone]       Time spent on Discharge: 38 minutes  Signed:   RAI,RIPUDEEP M.D. [facility] 09/04/2013, 10:01 AM Pager: [phone]   **Disclaimer: This note was dictated  with voice recognition software. Similar sounding words can inadvertently be transcribed and this note may contain transcription errors which may not have been corrected upon publication of note.**

## 2015-04-02 ENCOUNTER — Encounter (HOSPITAL_BASED_OUTPATIENT_CLINIC_OR_DEPARTMENT_OTHER): Payer: Self-pay | Admitting: *Deleted

## 2015-04-02 ENCOUNTER — Emergency Department (HOSPITAL_BASED_OUTPATIENT_CLINIC_OR_DEPARTMENT_OTHER): Payer: Self-pay

## 2015-04-02 ENCOUNTER — Emergency Department (HOSPITAL_BASED_OUTPATIENT_CLINIC_OR_DEPARTMENT_OTHER)
Admission: EM | Admit: 2015-04-02 | Discharge: 2015-04-02 | Disposition: A | Discharge status: Home / Self Care | Payer: Self-pay | Attending: Emergency Medicine | Admitting: Emergency Medicine

## 2015-04-02 DIAGNOSIS — E079 Disorder of thyroid, unspecified: Secondary | ICD-10-CM | POA: Insufficient documentation

## 2015-04-02 DIAGNOSIS — E785 Hyperlipidemia, unspecified: Secondary | ICD-10-CM | POA: Insufficient documentation

## 2015-04-02 DIAGNOSIS — I10 Essential (primary) hypertension: Secondary | ICD-10-CM | POA: Insufficient documentation

## 2015-04-02 DIAGNOSIS — H9203 Otalgia, bilateral: Secondary | ICD-10-CM | POA: Insufficient documentation

## 2015-04-02 DIAGNOSIS — K219 Gastro-esophageal reflux disease without esophagitis: Secondary | ICD-10-CM | POA: Insufficient documentation

## 2015-04-02 DIAGNOSIS — J111 Influenza due to unidentified influenza virus with other respiratory manifestations: Secondary | ICD-10-CM | POA: Insufficient documentation

## 2015-04-02 DIAGNOSIS — Z79899 Other long term (current) drug therapy: Secondary | ICD-10-CM | POA: Insufficient documentation

## 2015-04-02 MED ORDER — PSEUDOEPHEDRINE-GUAIFENESIN ER 120-1200 MG PO TB12: 1.0000 | ORAL_TABLET | Freq: Two times a day (BID) | ORAL | Status: DC | PRN

## 2015-04-02 MED ORDER — ACETAMINOPHEN 325 MG PO TABS
650.0000 mg | ORAL_TABLET | Freq: Once | ORAL | Status: AC
  Administered 2015-04-02: 650 mg via ORAL
  Filled 2015-04-02: qty 2

## 2015-04-02 MED ORDER — HYDROCOD POLST-CPM POLST ER 10-8 MG/5ML PO SUER: 5.0000 mL | Freq: Two times a day (BID) | ORAL | Status: DC | PRN

## 2015-04-02 NOTE — ED Notes (Addendum)
Pt c/o chills, fever, congestion, sore throat and cough  that started on Friday but worse today. C/o bilateral ear aches with cough. Has not had the flu shot this year. Has not had any meds pta.

## 2015-04-02 NOTE — ED Provider Notes (Signed)
CSN: [id number]     Arrival date & time 04/02/15  0310 History   First MD Initiated Contact with Patient 04/02/15 [phone]     Chief Complaint  Patient presents with  . flu symptoms      (Consider location/radiation/quality/duration/timing/severity/associated sxs/prior Treatment) HPI  This is a 55 year old female with a two-day history of flulike symptoms. Specifically she has had fever to 101.7, chills, body aches, sore throat, bilateral earache and productive cough. She denies wheezing or shortness of breath. She denies nausea, vomiting or diarrhea. She has been taking TheraFlu without adequate relief. Ear pain is worse with coughing. Throat pain is worse with swallowing. Symptoms are moderate  Past Medical History  Diagnosis Date  . Thyroid disease   . Hypertension   . History of irregular heartbeat   . HLD (hyperlipidemia)   . Bradycardia   . GERD (gastroesophageal reflux disease)    History reviewed. No pertinent past surgical history. Family History  Problem Relation Age of Onset  . Heart attack Father   . Heart attack Brother    Social History  Substance Use Topics  . Smoking status: Never Smoker   . Smokeless tobacco: Never Used  . Alcohol Use: No   OB History    No data available     Review of Systems  All other systems reviewed and are negative.   Allergies  Review of patient's allergies indicates no known allergies.  Home Medications   Prior to Admission medications   Medication Sig Start Date End Date Taking? Authorizing Provider  amLODipine (NORVASC) 5 MG tablet Take 5 mg by mouth daily.    Historical Provider, MD  atorvastatin (LIPITOR) 10 MG tablet Take 1 tablet (10 mg total) by mouth at bedtime. 09/04/13   Ripudeep Jenna Luo, MD  levothyroxine (SYNTHROID, LEVOTHROID) 100 MCG tablet Take 150 mcg by mouth daily.     Historical Provider, MD  loratadine (CLARITIN) 10 MG tablet Take 1 tablet (10 mg total) by mouth daily as needed for allergies. 09/04/13   Ripudeep  Jenna Luo, MD  nitroGLYCERIN (NITROSTAT) 0.4 MG SL tablet Place 0.4 mg under the tongue every 5 (five) minutes as needed for chest pain.    Historical Provider, MD  pantoprazole (PROTONIX) 40 MG tablet Take 1 tablet (40 mg total) by mouth daily. For heart burn/acid reflux 09/04/13   Ripudeep K Rai, MD   BP 131/82 mmHg  Pulse 73  Temp(Src) 101.7 F (38.7 C) (Oral)  Resp 18  Ht  (1.549 m)  Wt 144 lb (65.318 kg)  BMI 27.22 kg/m2  SpO2 95%   Physical Exam  General: Well-developed, well-nourished female in no acute distress; appearance consistent with age of record HENT: normocephalic; atraumatic; pharynx normal; TMs normal Eyes: pupils equal, round and reactive to light; extraocular muscles intact Neck: supple Heart: regular rate and rhythm Lungs: clear to auscultation bilaterally; occasional cough Abdomen: soft; nondistended; nontender; no masses or hepatosplenomegaly; bowel sounds present Extremities: No deformity; full range of motion; pulses normal Neurologic: Awake, alert and oriented; motor function intact in all extremities and symmetric; no facial droop Skin: Warm and dry Psychiatric: Normal mood and affect    ED Course  Procedures (including critical care time)   MDM  Nursing notes and vitals signs, including pulse oximetry, reviewed.  Summary of this visit's results, reviewed by myself:  Imaging Studies: Dg Chest 2 View  04/02/2015  CLINICAL DATA:  Acute onset of chills, fever and congestion. Sore throat and cough. Bilateral ear ache.  Initial encounter. EXAM: CHEST  2 VIEW COMPARISON:  Chest radiograph performed 09/03/2013 FINDINGS: The lungs are well-aerated and clear. There is no evidence of focal opacification, pleural effusion or pneumothorax. The heart is normal in size; the mediastinal contour is within normal limits. No acute osseous abnormalities are seen. IMPRESSION: No acute cardiopulmonary process seen. Electronically Signed   By: Roanna Raider M.D.   On:  04/02/2015 04:27       Paula Libra, MD 04/02/15 [phone]

## 2015-04-02 NOTE — ED Notes (Signed)
MD with pt  

## 2015-04-02 NOTE — ED Notes (Signed)
Transported to xray 

## 2015-04-02 NOTE — Discharge Instructions (Signed)

## 2015-04-02 NOTE — ED Notes (Signed)
Returned from xray

## 2015-10-02 IMAGING — CR DG CHEST 2V
2 series · 2 of 2 positions shown · non-contrast
Comparison: 10/02/2010

CLINICAL DATA: Mid chest pain 1 day.

EXAM:
CHEST  2 VIEW

[w chest pa]
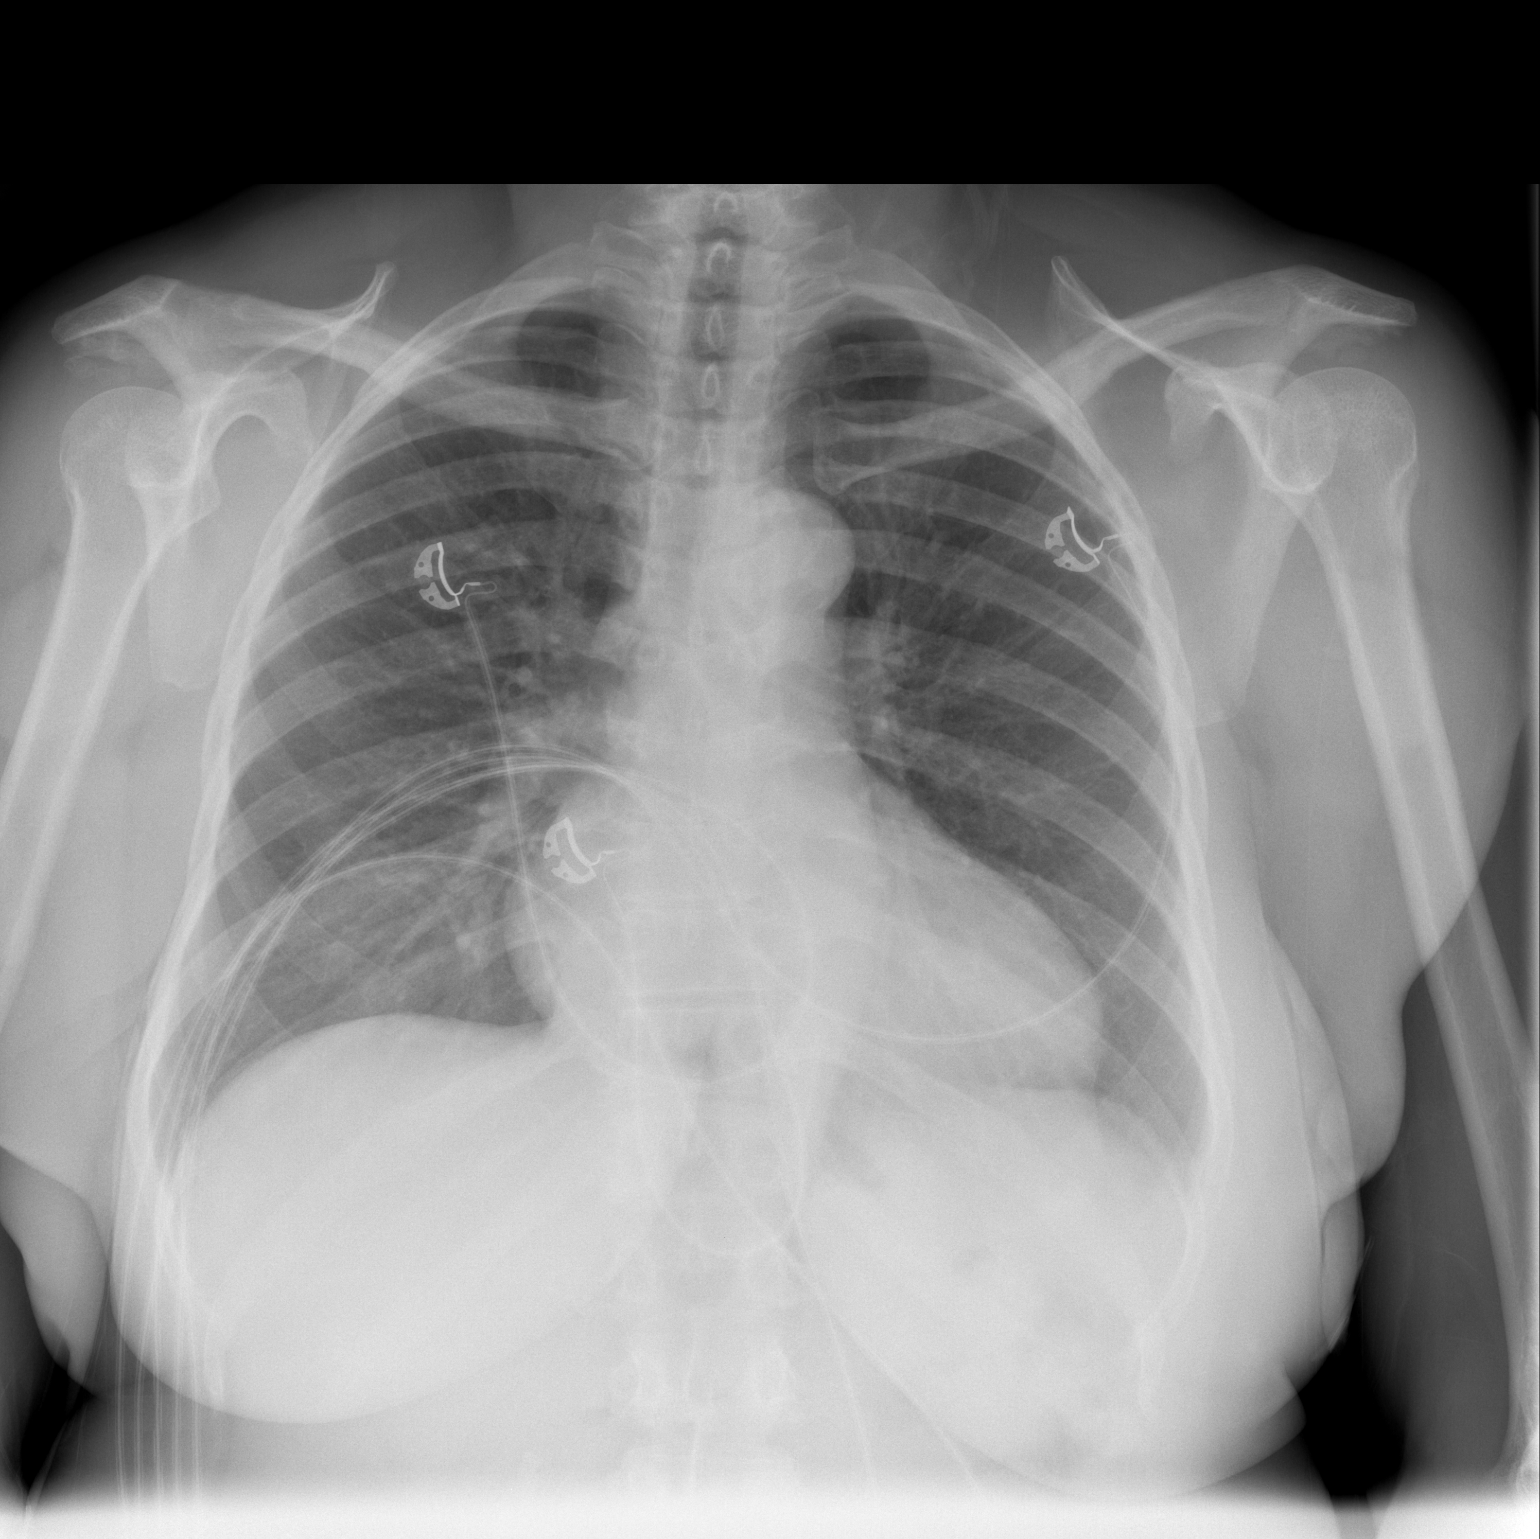

[w chest lat]
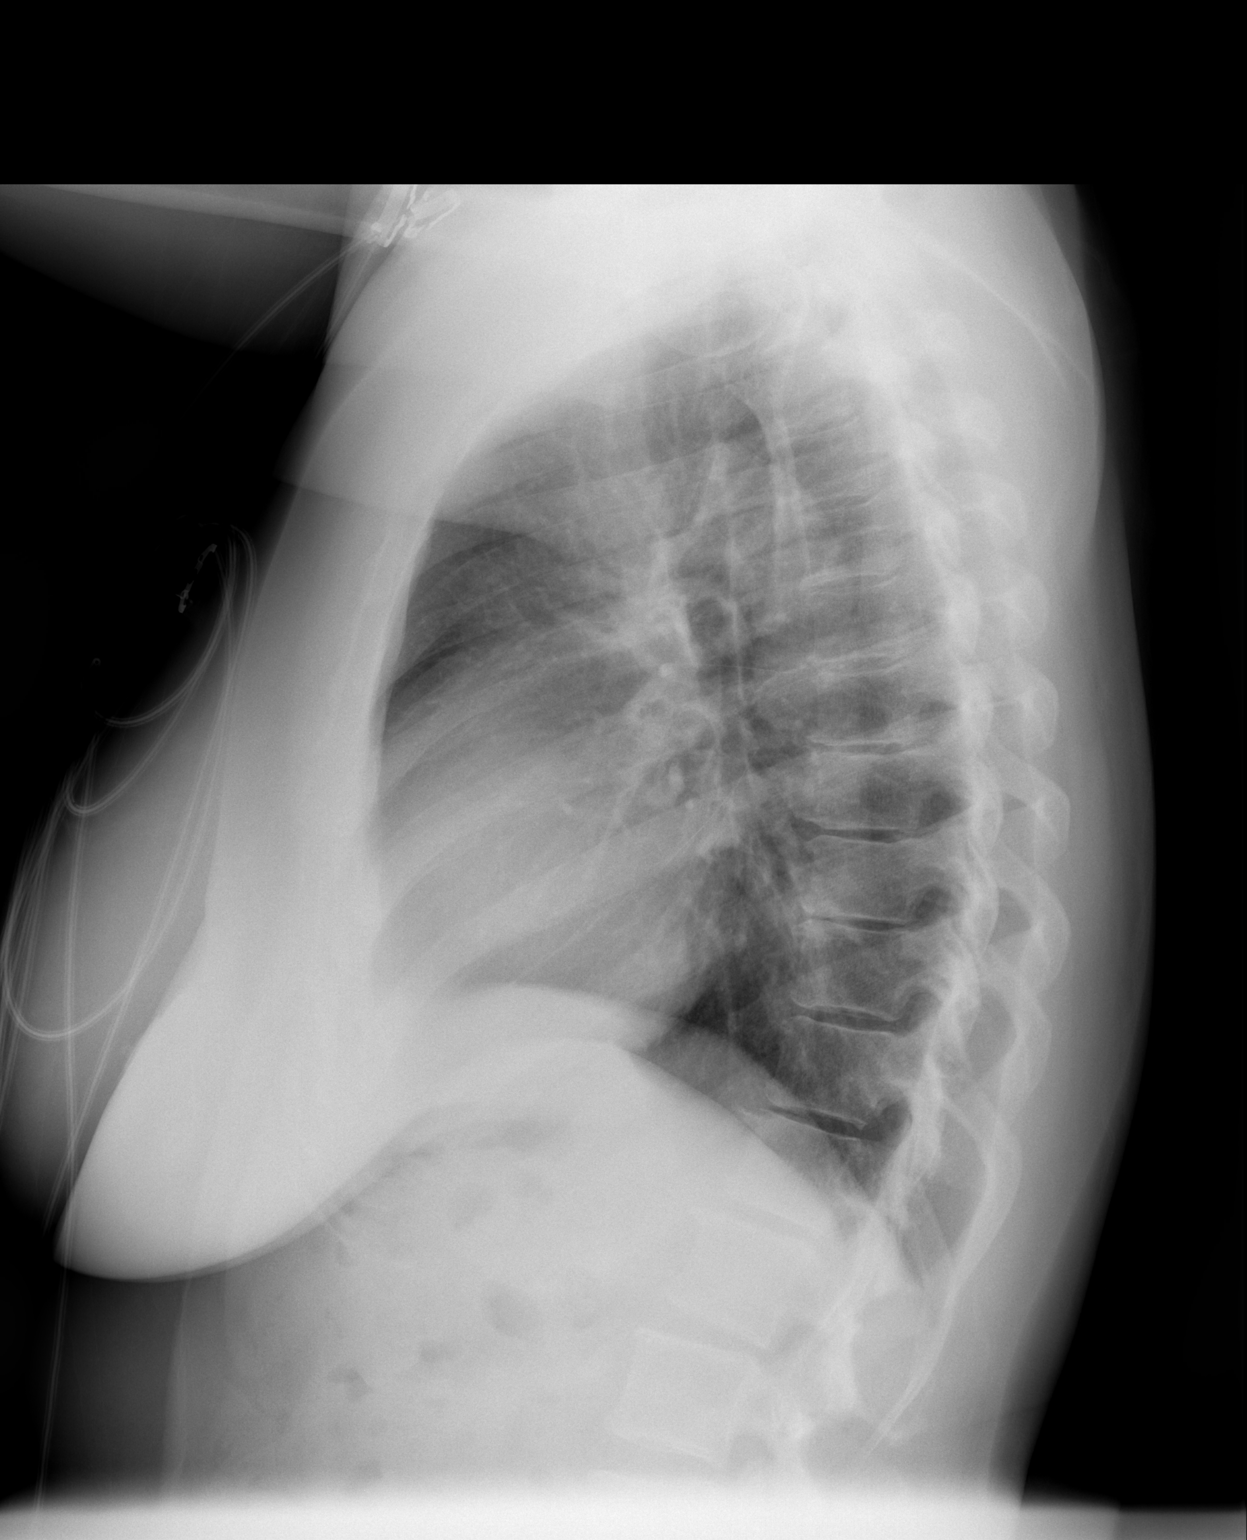

[2 of 2 positions shown; findings below may reference images not displayed]

FINDINGS: The heart size and mediastinal contours are within normal limits.
Both lungs are clear. Minimal degenerative change of the spine.
IMPRESSION: No active cardiopulmonary disease.

## 2019-02-05 ENCOUNTER — Encounter (HOSPITAL_BASED_OUTPATIENT_CLINIC_OR_DEPARTMENT_OTHER): Payer: Self-pay

## 2019-02-05 ENCOUNTER — Other Ambulatory Visit: Payer: Self-pay

## 2019-02-05 ENCOUNTER — Emergency Department (HOSPITAL_BASED_OUTPATIENT_CLINIC_OR_DEPARTMENT_OTHER): Payer: Self-pay

## 2019-02-05 ENCOUNTER — Emergency Department (HOSPITAL_BASED_OUTPATIENT_CLINIC_OR_DEPARTMENT_OTHER)
Admission: EM | Admit: 2019-02-05 | Discharge: 2019-02-05 | Disposition: A | Discharge status: Home / Self Care | Payer: Self-pay | Attending: Emergency Medicine | Admitting: Emergency Medicine

## 2019-02-05 DIAGNOSIS — I10 Essential (primary) hypertension: Secondary | ICD-10-CM | POA: Insufficient documentation

## 2019-02-05 DIAGNOSIS — Z79899 Other long term (current) drug therapy: Secondary | ICD-10-CM | POA: Insufficient documentation

## 2019-02-05 DIAGNOSIS — Z20822 Contact with and (suspected) exposure to covid-19: Secondary | ICD-10-CM | POA: Insufficient documentation

## 2019-02-05 DIAGNOSIS — R071 Chest pain on breathing: Secondary | ICD-10-CM | POA: Insufficient documentation

## 2019-02-05 DIAGNOSIS — R079 Chest pain, unspecified: Secondary | ICD-10-CM

## 2019-02-05 LAB — CBC
HCT: 40 % (ref 36.0–46.0)
Hemoglobin: 12.6 g/dL (ref 12.0–15.0)
MCH: 25.8 pg — ABNORMAL LOW (ref 26.0–34.0)
MCHC: 31.5 g/dL (ref 30.0–36.0)
MCV: 81.8 fL (ref 80.0–100.0)
Platelets: 287 10*3/uL (ref 150–400)
RBC: 4.89 MIL/uL (ref 3.87–5.11)
RDW: 14.1 % (ref 11.5–15.5)
WBC: 3.4 10*3/uL — ABNORMAL LOW (ref 4.0–10.5)
nRBC: 0 % (ref 0.0–0.2)

## 2019-02-05 LAB — BASIC METABOLIC PANEL
Anion gap: 11 (ref 5–15)
BUN: 19 mg/dL (ref 6–20)
CO2: 27 mmol/L (ref 22–32)
Calcium: 9.3 mg/dL (ref 8.9–10.3)
Chloride: 103 mmol/L (ref 98–111)
Creatinine, Ser: 0.95 mg/dL (ref 0.44–1.00)
GFR calc Af Amer: >60 mL/min (ref >60)
GFR calc non Af Amer: >60 mL/min (ref >60)
Glucose, Bld: 105 mg/dL — ABNORMAL HIGH (ref 70–99)
Potassium: 3.4 mmol/L — ABNORMAL LOW (ref 3.5–5.1)
Sodium: 141 mmol/L (ref 135–145)

## 2019-02-05 LAB — TROPONIN I (HIGH SENSITIVITY)
Troponin I (High Sensitivity): <2 ng/L (ref <18)
Troponin I (High Sensitivity): <2 ng/L (ref <18)

## 2019-02-05 LAB — SARS CORONAVIRUS 2 AG (30 MIN TAT): SARS Coronavirus 2 Ag: NEGATIVE

## 2019-02-05 MED ORDER — SODIUM CHLORIDE 0.9% FLUSH
3.0000 mL | Freq: Once | INTRAVENOUS | Status: DC
  Filled 2019-02-05: qty 3

## 2019-02-05 MED ORDER — CYCLOBENZAPRINE HCL 10 MG PO TABS: 10.0000 mg | ORAL_TABLET | Freq: Two times a day (BID) | ORAL | 0 refills | Status: DC | PRN

## 2019-02-05 NOTE — ED Provider Notes (Signed)
[facility] EMERGENCY DEPARTMENT Provider Note   CSN: [id number] Arrival date & time: 02/05/19  1622     History Chief Complaint  Patient presents with  . Chest Pain    Miranda Washington is a 59 y.o. female.  HPI     Chest pain started about 1.5 hr prior to coming in, around 3PM Pain feels like a spasm with deep breath, left side of chest and back with deep breaths, also feels it with shoulder movements. If deep breath feels it. Small breath doesn't feel it as much.  Nothing else makes it better or worse, not necessarily exertional..  No shortness of breath .  No nausea or vomiting . No fevers.  No cough, no body aches, no diarrhea, no sore throat , no numbness/weakness.  No sweating or dizziness.   No radiation to arm or jaw.  Landscape using blower backpack been having right sided arm pain from that.  Last time used it was xmas week. Did not have previous exertional CP.   No smoking, occ etoh, no drugs Dad had MI at 85, brother had MI stents younger than her, in 59s was probably around 63 Htn, hlpd No hx of DVT/PE, no leg pain or swelling, no recent surgeries/long trips  Had sick contact with COVID 19    Past Medical History:  Diagnosis Date  . Bradycardia   . GERD (gastroesophageal reflux disease)   . History of irregular heartbeat   . HLD (hyperlipidemia)   . Hypertension   . Thyroid disease     Patient Active Problem List   Diagnosis Date Noted  . Chest pain 09/03/2013  . HTN (hypertension) 09/03/2013  . Dyslipidemia 09/03/2013  . GERD (gastroesophageal reflux disease)   . Bradycardia   . HLD (hyperlipidemia)   . History of irregular heartbeat   . Hypertension   . Thyroid disease     History reviewed. No pertinent surgical history.   OB History   No obstetric history on file.     Family History  Problem Relation Age of Onset  . Heart attack Father   . Heart attack Brother     Social History   Tobacco Use  . Smoking status: Never Smoker    . Smokeless tobacco: Never Used  Substance Use Topics  . Alcohol use: No  . Drug use: No    Home Medications Prior to Admission medications   Medication Sig Start Date End Date Taking? Authorizing Provider  amLODipine (NORVASC) 5 MG tablet Take 5 mg by mouth daily.    [provider]  chlorpheniramine-HYDROcodone (TUSSIONEX PENNKINETIC ER) 10-8 MG/5ML SUER Take 5 mLs by mouth every 12 (twelve) hours as needed for cough. 04/02/15   Molpus, John, MD  cyclobenzaprine (FLEXERIL) 10 MG tablet Take 1 tablet (10 mg total) by mouth 2 (two) times daily as needed for muscle spasms. 02/05/19   Alvira Monday, MD  levothyroxine (SYNTHROID, LEVOTHROID) 100 MCG tablet Take 150 mcg by mouth daily.     [provider]  Pseudoephedrine-Guaifenesin (MUCINEX D) [phone] MG TB12 Take 1 tablet by mouth every 12 (twelve) hours as needed (for congestion). 04/02/15   Molpus, Jonny Ruiz, MD    Allergies    Patient has no known allergies.  Review of Systems   Review of Systems  Constitutional: Negative for fever.  HENT: Negative for sore throat.   Eyes: Negative for visual disturbance.  Respiratory: Negative for cough and shortness of breath.   Cardiovascular: Positive for chest pain.  Gastrointestinal:  Negative for abdominal pain, nausea and vomiting.  Genitourinary: Negative for difficulty urinating.  Musculoskeletal: Negative for back pain and neck pain.  Skin: Negative for rash.  Neurological: Negative for syncope, weakness, numbness and headaches.    Physical Exam Updated Vital Signs BP 130/70 (BP Location: Left Arm)   Pulse (!) 50   Temp 98.7 F (37.1 C) (Oral)   Resp 16   Ht 5\' 1"  (1.549 m)   Wt 72.6 kg   SpO2 99%   BMI 30.23 kg/m   Physical Exam Vitals and nursing note reviewed.  Constitutional:      General: She is not in acute distress.    Appearance: She is well-developed. She is not diaphoretic.  HENT:     Head: Normocephalic and atraumatic.  Eyes:      Conjunctiva/sclera: Conjunctivae normal.  Cardiovascular:     Rate and Rhythm: Normal rate and regular rhythm.     Heart sounds: Normal heart sounds. No murmur. No friction rub. No gallop.   Pulmonary:     Effort: Pulmonary effort is normal. No respiratory distress.     Breath sounds: Normal breath sounds. No wheezing or rales.  Abdominal:     General: There is no distension.     Palpations: Abdomen is soft.     Tenderness: There is no abdominal tenderness. There is no guarding.  Musculoskeletal:        General: No tenderness.     Cervical back: Normal range of motion.  Skin:    General: Skin is warm and dry.     Findings: No erythema or rash.  Neurological:     Mental Status: She is alert and oriented to person, place, and time.     ED Results / Procedures / Treatments   Labs (all labs ordered are listed, but only abnormal results are displayed) Labs Reviewed  BASIC METABOLIC PANEL - Abnormal; Notable for the following components:      Result Value   Potassium 3.4 (*)    Glucose, Bld 105 (*)    All other components within normal limits  CBC - Abnormal; Notable for the following components:   WBC 3.4 (*)    MCH 25.8 (*)    All other components within normal limits  SARS CORONAVIRUS 2 AG (30 MIN TAT)  NOVEL CORONAVIRUS, NAA (HOSP ORDER, SEND-OUT TO REF LAB; TAT 18-24 HRS)  TROPONIN I (HIGH SENSITIVITY)  TROPONIN I (HIGH SENSITIVITY)    EKG EKG Interpretation  Date/Time:  Friday February 05 2019 16:32:00 EST Ventricular Rate:  57 PR Interval:  180 QRS Duration: 78 QT Interval:  402 QTC Calculation: 391 R Axis:   0 Text Interpretation: Sinus bradycardia with marked sinus arrhythmia Low voltage QRS Cannot rule out Anterior infarct , age undetermined Abnormal ECG No significant change since last tracing Confirmed by 04-04-2004 (Alvira Monday) on 02/05/2019 6:46:38 PM   Radiology DG Chest 2 View  Result Date: 02/05/2019 CLINICAL DATA:  Chest pain EXAM: CHEST - 2 VIEW  COMPARISON:  04/02/2015 FINDINGS: The heart size and mediastinal contours are within normal limits. Both lungs are clear. The visualized skeletal structures are unremarkable. IMPRESSION: No active cardiopulmonary disease. Electronically Signed   By: 04/04/2015 M.D.   On: 02/05/2019 17:07    Procedures Procedures (including critical care time)  Medications Ordered in ED Medications - No data to display  ED Course  I have reviewed the triage vital signs and the nursing notes.  Pertinent labs & imaging results that were  available during my care of the patient were reviewed by me and considered in my medical decision making (see chart for details).    MDM Rules/Calculators/A&P                      59yo female with history of hyperlipidemia, hypertension, thyroid disease, family hx of CAD, presents with concern for chest pain. EKG without acute findings, no sign of pericarditis.  CXR without pneumonia, pneumothorax or edema.  No significant lab abnormalities.  Normal bilateral upper and extremity pulses, no abnormalities on XR, doubt dissection. No dyspnea, no hypoxia, no tachycardia, and despite pleuritic component given no shortness of breath I have a very low suspicion for PE. Unable to perform CTA at this facility at this time but given very low suspicion for PE or dissection I do not feel transfer is appropriate.  Delta troponins both less than 2. Doubt ACS. Does have some pain with movement of shoulder and consider MSK etiology. Has COVID 19 contact, will send testing and recommend quarantine.  Given cardiac risk factors, recommend Cardiology follow up. Patient discharged in stable condition with understanding of reasons to return.    Miranda Washington was evaluated in Emergency Department on 02/07/2019 for the symptoms described in the history of present illness. She was evaluated in the context of the global COVID-19 pandemic, which necessitated consideration that the patient might be at  risk for infection with the SARS-CoV-2 virus that causes COVID-19. Institutional protocols and algorithms that pertain to the evaluation of patients at risk for COVID-19 are in a state of rapid change based on information released by regulatory bodies including the CDC and federal and state organizations. These policies and algorithms were followed during the patient's care in the ED.    Final Clinical Impression(s) / ED Diagnoses Final diagnoses:  Nonspecific chest pain    Rx / DC Orders ED Discharge Orders         Ordered    cyclobenzaprine (FLEXERIL) 10 MG tablet  2 times daily PRN     02/05/19 2255           Gareth Morgan, MD 02/07/19 1350

## 2019-02-05 NOTE — ED Triage Notes (Signed)
Pt c/o CP x 1 hour-denies fever/flu sx-NAD-steady gait

## 2019-02-05 NOTE — Discharge Instructions (Signed)
Happy Birthday!  Given your contact with COVID19, continue to quarantine. Call Cardiology for a follow to occur after 2 weeks. If you have worsening chest pain or other concerning symptoms, please return to the ED for reevaluation.

## 2019-02-08 LAB — NOVEL CORONAVIRUS, NAA (HOSP ORDER, SEND-OUT TO REF LAB; TAT 18-24 HRS): SARS-CoV-2, NAA: NOT DETECTED

## 2019-08-31 ENCOUNTER — Emergency Department (HOSPITAL_BASED_OUTPATIENT_CLINIC_OR_DEPARTMENT_OTHER)
Admission: EM | Admit: 2019-08-31 | Discharge: 2019-08-31 | Disposition: A | Discharge status: Home / Self Care | Payer: BLUE CROSS/BLUE SHIELD | Attending: Emergency Medicine | Admitting: Emergency Medicine

## 2019-08-31 ENCOUNTER — Encounter (HOSPITAL_BASED_OUTPATIENT_CLINIC_OR_DEPARTMENT_OTHER): Payer: Self-pay | Admitting: Emergency Medicine

## 2019-08-31 ENCOUNTER — Other Ambulatory Visit: Payer: Self-pay

## 2019-08-31 DIAGNOSIS — Z79890 Hormone replacement therapy (postmenopausal): Secondary | ICD-10-CM | POA: Insufficient documentation

## 2019-08-31 DIAGNOSIS — Z20822 Contact with and (suspected) exposure to covid-19: Secondary | ICD-10-CM | POA: Insufficient documentation

## 2019-08-31 DIAGNOSIS — R05 Cough: Secondary | ICD-10-CM | POA: Diagnosis present

## 2019-08-31 DIAGNOSIS — Z79899 Other long term (current) drug therapy: Secondary | ICD-10-CM | POA: Insufficient documentation

## 2019-08-31 DIAGNOSIS — I10 Essential (primary) hypertension: Secondary | ICD-10-CM | POA: Diagnosis not present

## 2019-08-31 DIAGNOSIS — J3489 Other specified disorders of nose and nasal sinuses: Secondary | ICD-10-CM | POA: Diagnosis not present

## 2019-08-31 DIAGNOSIS — E079 Disorder of thyroid, unspecified: Secondary | ICD-10-CM | POA: Diagnosis not present

## 2019-08-31 LAB — SARS CORONAVIRUS 2 BY RT PCR (HOSPITAL ORDER, PERFORMED IN ~~LOC~~ HOSPITAL LAB): SARS Coronavirus 2: NEGATIVE

## 2019-08-31 MED ORDER — AZELASTINE HCL 0.1 % NA SOLN: 2.0000 | Freq: Two times a day (BID) | NASAL | 12 refills | Status: AC

## 2019-08-31 MED ORDER — LORATADINE 10 MG PO TABS: 10.0000 mg | ORAL_TABLET | Freq: Every day | ORAL | 0 refills | Status: AC

## 2019-08-31 MED FILL — LORATADINE 10 MG TABS: 10 | 90 days supply | Qty: 90 | Fill #0

## 2019-08-31 MED FILL — AZELASTINE HCL 137 MCG/SPRA: 137 | 25 days supply | Qty: 30 | Fill #0

## 2019-08-31 NOTE — ED Triage Notes (Signed)
Runny nose and cough x4 days since being exposed to fingernail chemical fumes.  Sts she is tired of it and doesn't want it to turn into a cold.

## 2019-08-31 NOTE — ED Provider Notes (Signed)
[facility] EMERGENCY DEPARTMENT Provider Note   CSN: [id number] Arrival date & time: 08/31/19  0947     History Chief Complaint  Patient presents with   runny nose and cough    Miranda Washington is a 59 y.o. female.  HPI   Pt is a 59 y/o female with a h/o bradycardia, GERD, HLD, HTN, thyroid disease who presents to the ED today for eval of rhinorrhea and cough that started 5 days ago. Cough is mostly nonproductive and she states she mostly has it at night when she has post nasal drip. States she is not really coughing throughout the day. Denies nasal congestion, sore throat, fevers, sneezing, itchy/watery eyes. Denies SOB/chest pain. She has tried tylenol cold and flu, zyrtec, and nasal spray. States that symptoms have improved somewhat with medication.  Past Medical History:  Diagnosis Date   Bradycardia    GERD (gastroesophageal reflux disease)    History of irregular heartbeat    HLD (hyperlipidemia)    Hypertension    Thyroid disease     Patient Active Problem List   Diagnosis Date Noted   Chest pain 09/03/2013   HTN (hypertension) 09/03/2013   Dyslipidemia 09/03/2013   GERD (gastroesophageal reflux disease)    Bradycardia    HLD (hyperlipidemia)    History of irregular heartbeat    Hypertension    Thyroid disease     History reviewed. No pertinent surgical history.   OB History   No obstetric history on file.     Family History  Problem Relation Age of Onset   Heart attack Father    Heart attack Brother     Social History   Tobacco Use   Smoking status: Never Smoker   Smokeless tobacco: Never Used  Vaping Use   Vaping Use: Never used  Substance Use Topics   Alcohol use: No   Drug use: No    Home Medications Prior to Admission medications   Medication Sig Start Date End Date Taking? Authorizing Provider  amLODipine (NORVASC) 5 MG tablet Take 5 mg by mouth daily.    [provider]  azelastine (ASTELIN)  0.1 % nasal spray Place 2 sprays into both nostrils 2 (two) times daily. Use in each nostril as directed 08/31/19   Emory Gallentine S, PA-C  levothyroxine (SYNTHROID) 150 MCG tablet Take 150 mcg by mouth daily. 03/18/19   [provider]  loratadine (CLARITIN) 10 MG tablet Take 1 tablet (10 mg total) by mouth daily. 08/31/19 09/30/19  Audry Pecina S, PA-C  meclizine (ANTIVERT) 25 MG tablet Take 25 mg by mouth daily as needed. 06/09/19   [provider]    Allergies    Patient has no known allergies.  Review of Systems   Review of Systems  Constitutional: Negative for fever.  HENT: Positive for rhinorrhea. Negative for congestion and sore throat.   Respiratory: Positive for cough. Negative for shortness of breath.   Cardiovascular: Negative for chest pain.  Gastrointestinal: Negative for abdominal pain, constipation, diarrhea, nausea and vomiting.  Musculoskeletal: Negative for myalgias.  Neurological: Negative for headaches.    Physical Exam Updated Vital Signs BP (!) 142/92 (BP Location: Right Arm)    Pulse 70    Temp 98.3 F (36.8 C) (Oral)    Resp 16    Ht 5' 1.5" (1.562 m)    Wt 68.6 kg    SpO2 99%    BMI 28.11 kg/m   Physical Exam Vitals and nursing note reviewed.  Constitutional:  General: She is not in acute distress.    Appearance: She is well-developed.  HENT:     Head: Normocephalic and atraumatic.     Nose:     Comments: Swelling to bilat nasal turbinates Eyes:     Conjunctiva/sclera: Conjunctivae normal.  Cardiovascular:     Rate and Rhythm: Normal rate and regular rhythm.     Heart sounds: Normal heart sounds.  Pulmonary:     Effort: Pulmonary effort is normal.     Breath sounds: Normal breath sounds. No wheezing, rhonchi or rales.  Musculoskeletal:        General: Normal range of motion.     Cervical back: Neck supple.  Skin:    General: Skin is warm and dry.  Neurological:     Mental Status: She is alert.     ED Results /  Procedures / Treatments   Labs (all labs ordered are listed, but only abnormal results are displayed) Labs Reviewed  SARS CORONAVIRUS 2 BY RT PCR (HOSPITAL ORDER, PERFORMED IN [facility])    EKG None  Radiology No results found.  Procedures Procedures (including critical care time)  Medications Ordered in ED Medications - No data to display  ED Course  I have reviewed the triage vital signs and the nursing notes.  Pertinent labs & imaging results that were available during my care of the patient were reviewed by me and considered in my medical decision making (see chart for details).    MDM Rules/Calculators/A&P                          59 year old female presenting for evaluation of rhinorrhea for the last 5 days.  She also has some postnasal drip causing a mild cough only at night.  Does not have persistent cough therefore suspicion for pneumonia is lower on the differential and lung sounded clear on exam.  She may have a viral upper respiratory infections as is Covid and she was tested for this.  Results pending at the time of discharge.  Symptoms may also be related to seasonal allergies.  Will give Rx for azelastine nasal spray as well as Claritin.  Advised on PCP follow-up which she has in the next 2 weeks.  Advised on return precautions.  She voiced understanding of plan reasons to return.  All questions answered.  Patient stable for discharge.  Miranda Washington was evaluated in Emergency Department on 08/31/2019 for the symptoms described in the history of present illness. She was evaluated in the context of the global COVID-19 pandemic, which necessitated consideration that the patient might be at risk for infection with the SARS-CoV-2 virus that causes COVID-19. Institutional protocols and algorithms that pertain to the evaluation of patients at risk for COVID-19 are in a state of rapid change based on information released by regulatory bodies including the CDC and  federal and state organizations. These policies and algorithms were followed during the patient's care in the ED.   Final Clinical Impression(s) / ED Diagnoses Final diagnoses:  Rhinorrhea    Rx / DC Orders ED Discharge Orders         Ordered    azelastine (ASTELIN) 0.1 % nasal spray  2 times daily     Discontinue  Reprint     08/31/19 0949    loratadine (CLARITIN) 10 MG tablet  Daily     Discontinue  Reprint     08/31/19 0949  Rayne Du 08/31/19 1601    Terald Sleeper, MD 08/31/19 [phone]

## 2019-08-31 NOTE — Discharge Instructions (Addendum)
Take the nasal spray and claritin as directed. You can stop taking the zyrtec and start taking claritin instead.   Please follow up with your primary care provider within 5-7 days for re-evaluation of your symptoms. If you do not have a primary care provider, information for a healthcare clinic has been provided for you to make arrangements for follow up care. Please return to the emergency department for any new or worsening symptoms.

## 2020-07-03 ENCOUNTER — Encounter (HOSPITAL_BASED_OUTPATIENT_CLINIC_OR_DEPARTMENT_OTHER): Payer: Self-pay | Admitting: *Deleted

## 2020-07-03 ENCOUNTER — Emergency Department (HOSPITAL_BASED_OUTPATIENT_CLINIC_OR_DEPARTMENT_OTHER): Payer: Self-pay

## 2020-07-03 ENCOUNTER — Other Ambulatory Visit: Payer: Self-pay

## 2020-07-03 ENCOUNTER — Emergency Department (HOSPITAL_BASED_OUTPATIENT_CLINIC_OR_DEPARTMENT_OTHER)
Admission: EM | Admit: 2020-07-03 | Discharge: 2020-07-03 | Disposition: A | Discharge status: Home / Self Care | Payer: Self-pay | Attending: Emergency Medicine | Admitting: Emergency Medicine

## 2020-07-03 DIAGNOSIS — R110 Nausea: Secondary | ICD-10-CM | POA: Insufficient documentation

## 2020-07-03 DIAGNOSIS — Z79899 Other long term (current) drug therapy: Secondary | ICD-10-CM | POA: Insufficient documentation

## 2020-07-03 DIAGNOSIS — R42 Dizziness and giddiness: Secondary | ICD-10-CM | POA: Insufficient documentation

## 2020-07-03 DIAGNOSIS — I10 Essential (primary) hypertension: Secondary | ICD-10-CM | POA: Insufficient documentation

## 2020-07-03 LAB — BASIC METABOLIC PANEL
Anion gap: 9 (ref 5–15)
BUN: 18 mg/dL (ref 8–23)
CO2: 24 mmol/L (ref 22–32)
Calcium: 8.8 mg/dL — ABNORMAL LOW (ref 8.9–10.3)
Chloride: 107 mmol/L (ref 98–111)
Creatinine, Ser: 0.94 mg/dL (ref 0.44–1.00)
GFR, Estimated: >60 mL/min (ref >60)
Glucose, Bld: 99 mg/dL (ref 70–99)
Potassium: 3.7 mmol/L (ref 3.5–5.1)
Sodium: 140 mmol/L (ref 135–145)

## 2020-07-03 LAB — CBC WITH DIFFERENTIAL/PLATELET
Abs Immature Granulocytes: 0 10*3/uL (ref 0.00–0.07)
Basophils Absolute: 0 10*3/uL (ref 0.0–0.1)
Basophils Relative: 0 %
Eosinophils Absolute: 0.1 10*3/uL (ref 0.0–0.5)
Eosinophils Relative: 4 %
HCT: 39.8 % (ref 36.0–46.0)
Hemoglobin: 12.8 g/dL (ref 12.0–15.0)
Immature Granulocytes: 0 %
Lymphocytes Relative: 48 %
Lymphs Abs: 1.5 10*3/uL (ref 0.7–4.0)
MCH: 26.1 pg (ref 26.0–34.0)
MCHC: 32.2 g/dL (ref 30.0–36.0)
MCV: 81.1 fL (ref 80.0–100.0)
Monocytes Absolute: 0.4 10*3/uL (ref 0.1–1.0)
Monocytes Relative: 13 %
Neutro Abs: 1.1 10*3/uL — ABNORMAL LOW (ref 1.7–7.7)
Neutrophils Relative %: 35 %
Platelets: 272 10*3/uL (ref 150–400)
RBC: 4.91 MIL/uL (ref 3.87–5.11)
RDW: 14.4 % (ref 11.5–15.5)
WBC: 3.2 10*3/uL — ABNORMAL LOW (ref 4.0–10.5)
nRBC: 0 % (ref 0.0–0.2)

## 2020-07-03 MED ORDER — ONDANSETRON HCL 4 MG/2ML IJ SOLN
4.0000 mg | Freq: Once | INTRAMUSCULAR | Status: AC
  Administered 2020-07-03: 4 mg via INTRAVENOUS
  Filled 2020-07-03: qty 2

## 2020-07-03 MED ORDER — MECLIZINE HCL 25 MG PO TABS: 25.0000 mg | ORAL_TABLET | Freq: Three times a day (TID) | ORAL | 0 refills | Status: AC

## 2020-07-03 MED ORDER — MECLIZINE HCL 25 MG PO TABS
25.0000 mg | ORAL_TABLET | Freq: Once | ORAL | Status: AC
  Administered 2020-07-03: 25 mg via ORAL
  Filled 2020-07-03: qty 1

## 2020-07-03 MED ORDER — ONDANSETRON 4 MG PO TBDP: 4.0000 mg | ORAL_TABLET | Freq: Three times a day (TID) | ORAL | 0 refills | Status: AC | PRN

## 2020-07-03 NOTE — ED Triage Notes (Signed)
Pt reports intermittent dizziness, nausea without vomiting, and sharp substernal CP with inspiration x 2 weeks.   Pt ambulatory. No distress noted.

## 2020-07-03 NOTE — Discharge Instructions (Addendum)
Take the meclizine as directed as needed for dizziness. You can also take the Zofran every 8 hours as needed for nausea. Stay hydrated. Follow close with your primary care provider. Return to the ED if you develop persistent dizziness that is not better with position change with medication, uncontrollable vomiting, or other concerning symptoms.

## 2020-07-03 NOTE — ED Provider Notes (Signed)
[facility] EMERGENCY DEPARTMENT Provider Note   CSN: [id number] Arrival date & time: 07/03/20  1339     History Chief Complaint  Patient presents with  . Dizziness    Miranda Washington is a 60 y.o. female with PMHx HTN, GERD, HLD, vertigo, presenting to the ED with complaint of dizziness. Patient has had this same dizziness in the past, treated with meclizine with relief. She states she had a few meclizine left when her symptoms began 2 weeks ago, only has 1 tab left. It has helped her symptoms.  Describes room spinning dizziness that is occurs with certain positions or movement, such as bending forward or moving too quickly.  It is better with rest.  She has some associated nausea without vomiting.  She is not feeling fatigued or weak or lightheaded.  No headache, double vision or loss of vision.  No recent head injury. No numbness/weakness/speech difficulty.  Regarding patient's report of chest pain and triage note.  She has had 2 total episodes of sharp chest pain since January of this year.  Last episode was over a month ago.  It is a brief sharp chest pain when she took a deep breath.  It is not a persisting pain, it lasts seconds. No active.   The history is provided by the patient.       Past Medical History:  Diagnosis Date  . Bradycardia   . GERD (gastroesophageal reflux disease)   . History of irregular heartbeat   . HLD (hyperlipidemia)   . Hypertension   . Thyroid disease     Patient Active Problem List   Diagnosis Date Noted  . Chest pain 09/03/2013  . HTN (hypertension) 09/03/2013  . Dyslipidemia 09/03/2013  . GERD (gastroesophageal reflux disease)   . Bradycardia   . HLD (hyperlipidemia)   . History of irregular heartbeat   . Hypertension   . Thyroid disease     History reviewed. No pertinent surgical history.   OB History   No obstetric history on file.     Family History  Problem Relation Age of Onset  . Heart attack Father   . Heart  attack Brother     Social History   Tobacco Use  . Smoking status: Never Smoker  . Smokeless tobacco: Never Used  Vaping Use  . Vaping Use: Never used  Substance Use Topics  . Alcohol use: No  . Drug use: No    Home Medications Prior to Admission medications   Medication Sig Start Date End Date Taking? Authorizing Provider  ondansetron (ZOFRAN ODT) 4 MG disintegrating tablet Take 1 tablet (4 mg total) by mouth every 8 (eight) hours as needed for nausea or vomiting. 07/03/20  Yes Jonanthony Nahar, [place] N, PA-C  amLODipine (NORVASC) 5 MG tablet Take 5 mg by mouth daily.    [provider]  azelastine (ASTELIN) 0.1 % nasal spray Place 2 sprays into both nostrils 2 (two) times daily. Use in each nostril as directed 08/31/19   Couture, Cortni S, PA-C  levothyroxine (SYNTHROID) 150 MCG tablet Take 150 mcg by mouth daily. 03/18/19   [provider]  loratadine (CLARITIN) 10 MG tablet Take 1 tablet (10 mg total) by mouth daily. 08/31/19 09/30/19  Couture, Cortni S, PA-C  meclizine (ANTIVERT) 25 MG tablet Take 1 tablet (25 mg total) by mouth 3 (three) times daily. 07/03/20   Alayzha An, [place] N, PA-C    Allergies    Patient has no known allergies.  Review of Systems  Review of Systems  All other systems reviewed and are negative.   Physical Exam Updated Vital Signs BP (!) 137/91   Pulse 67   Temp 98.4 F (36.9 C)   Resp (!) 22   Ht 5' 1.5" (1.562 m)   Wt 73.5 kg   SpO2 98%   BMI 30.11 kg/m   Physical Exam Vitals and nursing note reviewed.  Constitutional:      Appearance: She is well-developed.  HENT:     Head: Normocephalic and atraumatic.  Eyes:     Conjunctiva/sclera: Conjunctivae normal.  Cardiovascular:     Rate and Rhythm: Normal rate and regular rhythm.  Pulmonary:     Effort: Pulmonary effort is normal. No respiratory distress.     Breath sounds: Normal breath sounds.  Abdominal:     General: Bowel sounds are normal.     Palpations: Abdomen is soft.      Tenderness: There is no abdominal tenderness. There is no guarding or rebound.  Musculoskeletal:     Right lower leg: No edema.     Left lower leg: No edema.  Skin:    General: Skin is warm.  Neurological:     Mental Status: She is alert.     Comments: Mental Status:  Alert, oriented, thought content appropriate, able to give a coherent history. Speech fluent without evidence of aphasia. Able to follow 2 step commands without difficulty.  Cranial Nerves:  II:   pupils equal, round, reactive to light III,IV, VI: ptosis not present, extra-ocular motions intact bilaterally  V,VII: smile symmetric, facial light touch sensation equal VIII: hearing grossly normal to voice  X: uvula elevates symmetrically  XI: bilateral shoulder shrug symmetric and strong XII: midline tongue extension without fassiculations Motor:  Normal tone. 5/5 strength in upper and lower extremities bilaterally including strong and equal grip strength and dorsiflexion/plantar flexion Sensory: grossly normal in all extremities.  Cerebellar: normal finger-to-nose with bilateral upper extremities Gait: deferred due to dizziness CV: distal pulses palpable throughout    Psychiatric:        Behavior: Behavior normal.     ED Results / Procedures / Treatments   Labs (all labs ordered are listed, but only abnormal results are displayed) Labs Reviewed  BASIC METABOLIC PANEL - Abnormal; Notable for the following components:      Result Value   Calcium 8.8 (*)    All other components within normal limits  CBC WITH DIFFERENTIAL/PLATELET - Abnormal; Notable for the following components:   WBC 3.2 (*)    Neutro Abs 1.1 (*)    All other components within normal limits    EKG None  Radiology DG Chest 2 View  Result Date: 07/03/2020 CLINICAL DATA:  Chest pain EXAM: CHEST - 2 VIEW COMPARISON:  None. FINDINGS: Cardiomegaly. Both lungs are clear. Disc degenerative disease of the thoracic spine. IMPRESSION:  Cardiomegaly without acute abnormality of the lungs. Electronically Signed   By: Lauralyn Primes M.D.   On: 07/03/2020 15:17    Procedures Procedures   Medications Ordered in ED Medications  meclizine (ANTIVERT) tablet 25 mg (25 mg Oral Given 07/03/20 1429)  ondansetron (ZOFRAN) injection 4 mg (4 mg Intravenous Given 07/03/20 1445)    ED Course  I have reviewed the triage vital signs and the nursing notes.  Pertinent labs & imaging results that were available during my care of the patient were reviewed by me and considered in my medical decision making (see chart for details).  Clinical Course as of  07/03/20 1629  Mon Jul 03, 2020  1619 Patient reports improvement in sx. Dizziness resolved [JR]    Clinical Course User Index [JR] Adamariz Gillott, [place] N, PA-C   MDM Rules/Calculators/A&P                          Patient is presenting for evaluation of vertigo.  History of peripheral vertigo, symptoms feel the same.  Presentation is most consistent with BPPV. Only had a few meclizine left, which is improving her symptoms both at home and here in the ED.  No focal neurodeficits.  Screening blood work is negative.  EKG shows sinus arrhythmia, though she is asymptomatic and has documented history of bradycardia past.  Do not believe this is related.  She reports resolution in symptoms after meclizine and Zofran.  She is ambulating with steady gait without recurrent dizziness.  Will prescribe meclizine and Zofran, states she has an appointment with her PCP on Thursday for close follow-up.  Discussed return precautions.  She is discharged in no distress.  Discussed results, findings, treatment and follow up. Patient advised of return precautions. Patient verbalized understanding and agreed with plan.  Final Clinical Impression(s) / ED Diagnoses Final diagnoses:  Vertigo    Rx / DC Orders ED Discharge Orders         Ordered    ondansetron (ZOFRAN ODT) 4 MG disintegrating tablet  Every 8 hours PRN         07/03/20 1622    meclizine (ANTIVERT) 25 MG tablet  3 times daily        07/03/20 1622           Kendry Pfarr, [place] N, [place] 07/03/20 1629    Gwyneth Sprout, MD 07/05/20 1818

## 2023-01-08 ENCOUNTER — Emergency Department (HOSPITAL_BASED_OUTPATIENT_CLINIC_OR_DEPARTMENT_OTHER)
Admission: EM | Admit: 2023-01-08 | Discharge: 2023-01-09 | Disposition: A | Discharge status: Home / Self Care | Payer: Commercial Managed Care - HMO | Attending: Emergency Medicine | Admitting: Emergency Medicine

## 2023-01-08 ENCOUNTER — Encounter (HOSPITAL_BASED_OUTPATIENT_CLINIC_OR_DEPARTMENT_OTHER): Payer: Self-pay | Admitting: Emergency Medicine

## 2023-01-08 ENCOUNTER — Other Ambulatory Visit: Payer: Self-pay

## 2023-01-08 DIAGNOSIS — Z79899 Other long term (current) drug therapy: Secondary | ICD-10-CM | POA: Diagnosis not present

## 2023-01-08 DIAGNOSIS — I10 Essential (primary) hypertension: Secondary | ICD-10-CM | POA: Insufficient documentation

## 2023-01-08 DIAGNOSIS — E039 Hypothyroidism, unspecified: Secondary | ICD-10-CM | POA: Insufficient documentation

## 2023-01-08 DIAGNOSIS — N814 Uterovaginal prolapse, unspecified: Secondary | ICD-10-CM | POA: Insufficient documentation

## 2023-01-08 LAB — URINALYSIS, MICROSCOPIC (REFLEX)

## 2023-01-08 LAB — URINALYSIS, ROUTINE W REFLEX MICROSCOPIC
Bilirubin Urine: NEGATIVE
Glucose, UA: NEGATIVE mg/dL
Ketones, ur: NEGATIVE mg/dL
Leukocytes,Ua: NEGATIVE
Nitrite: NEGATIVE
Protein, ur: NEGATIVE mg/dL
Specific Gravity, Urine: <=1.005 (ref 1.005–1.030)
pH: 5 (ref 5.0–8.0)

## 2023-01-08 LAB — CBG MONITORING, ED: Glucose-Capillary: 84 mg/dL (ref 70–99)

## 2023-01-08 NOTE — ED Provider Notes (Signed)
[place] EMERGENCY DEPARTMENT AT [facility] Provider Note   CSN: [id number] Arrival date & time: 01/08/23  1946     History  Chief Complaint  Patient presents with   Vaginal Prolapse    Miranda Washington is a 62 y.o. female.  Patient is a 62 year old female with a past medical history of hypertension and hypothyroidism presenting to the emergency department with concern for uterine prolapse.  Patient reports for at least the last month she has had feelings of intermittent uterine prolapse.  She states that she feels a bulge come out of her vagina and states that she is seen this and taken pictures of it.  She states that it has been happening more frequently.  She states that she has started to develop urinary frequency and associated increased thirst.  She denies any dysuria or hematuria.  She states that she has had a small amount of spotting when she wipes.  She states that often when she lays down her prolapse will resolve.  She states that she has seen a urologist in the past but was not having prolapse at that time so they were able to do any specific treatment.  The history is provided by the patient.       Home Medications Prior to Admission medications   Medication Sig Start Date End Date Taking? Authorizing Provider  amLODipine (NORVASC) 5 MG tablet Take 5 mg by mouth daily.    [provider]  azelastine (ASTELIN) 0.1 % nasal spray Place 2 sprays into both nostrils 2 (two) times daily. Use in each nostril as directed 08/31/19   Couture, Cortni S, PA-C  levothyroxine (SYNTHROID) 150 MCG tablet Take 150 mcg by mouth daily. 03/18/19   [provider]  loratadine (CLARITIN) 10 MG tablet Take 1 tablet (10 mg total) by mouth daily. 08/31/19 09/30/19  Couture, Cortni S, PA-C  meclizine (ANTIVERT) 25 MG tablet Take 1 tablet (25 mg total) by mouth 3 (three) times daily. 07/03/20   Robinson, [place] N, PA-C  ondansetron (ZOFRAN ODT) 4 MG disintegrating tablet  Take 1 tablet (4 mg total) by mouth every 8 (eight) hours as needed for nausea or vomiting. 07/03/20   Robinson, [place] N, PA-C      Allergies    Patient has no known allergies.    Review of Systems   Review of Systems  Physical Exam Updated Vital Signs BP (!) 173/91 (BP Location: Right Arm)   Pulse 79   Temp 98.1 F (36.7 C)   Resp 18   Ht 5' 1.5" (1.562 m)   Wt 72.6 kg   SpO2 100%   BMI 29.74 kg/m  Physical Exam Vitals and nursing note reviewed.  Constitutional:      General: She is not in acute distress.    Appearance: Normal appearance.  HENT:     Head: Normocephalic and atraumatic.     Nose: Nose normal.     Mouth/Throat:     Mouth: Mucous membranes are moist.  Eyes:     Extraocular Movements: Extraocular movements intact.  Cardiovascular:     Rate and Rhythm: Normal rate and regular rhythm.     Heart sounds: Normal heart sounds.  Pulmonary:     Effort: Pulmonary effort is normal.     Breath sounds: Normal breath sounds.  Abdominal:     General: Abdomen is flat.     Palpations: Abdomen is soft.     Tenderness: There is no abdominal tenderness.  Musculoskeletal:  General: Normal range of motion.     Cervical back: Normal range of motion.  Skin:    General: Skin is warm and dry.  Neurological:     General: No focal deficit present.     Mental Status: She is alert and oriented to person, place, and time.  Psychiatric:        Mood and Affect: Mood normal.        Behavior: Behavior normal.     ED Results / Procedures / Treatments   Labs (all labs ordered are listed, but only abnormal results are displayed) Labs Reviewed  URINALYSIS, ROUTINE W REFLEX MICROSCOPIC  CBG MONITORING, ED    EKG None  Radiology No results found.  Procedures Procedures    Medications Ordered in ED Medications - No data to display  ED Course/ Medical Decision Making/ A&P Clinical Course as of 01/08/23 2308  Wed Jan 08, 2023  2305 Patient eloped prior to UA  results.  [VK]    Clinical Course User Index [VK] Rexford Maus, DO                                 Medical Decision Making This patient presents to the ED with chief complaint(s) of vaginal prolapse, urinary frequency with pertinent past medical history of HTN which further complicates the presenting complaint. The complaint involves an extensive differential diagnosis and also carries with it a high risk of complications and morbidity.    The differential diagnosis includes uterine prolapse, UTI, hyperglycemia  Additional history obtained: Additional history obtained from N/A Records reviewed Care Everywhere/External Records  ED Course and Reassessment: On patient's arrival she is hypertensive otherwise hemodynamically stable in no acute distress.  Did have urine collected in triage that was pending.  Patient's associated increased thirst will additionally have Accu-Chek performed to evaluate for hyperglycemia.  The patient does have a picture of her phone does appear consistent with uterine prolapse.  I informed patient that she will need to follow-up back up with her gynecologist and would likely benefit from a pessary.  Independent labs interpretation:  The following labs were independently interpreted: glucose normal, UA pending  Independent visualization of imaging: - N/A  Consultation: - Consulted or discussed management/test interpretation w/ external professional: N/A  Consideration for admission or further workup: patient eloped prior to completion of her evaluation Social Determinants of health: N/A    Amount and/or Complexity of Data Reviewed Labs: ordered.          Final Clinical Impression(s) / ED Diagnoses Final diagnoses:  Uterine prolapse    Rx / DC Orders ED Discharge Orders     None         Rexford Maus, DO 01/08/23 2308

## 2023-01-08 NOTE — ED Triage Notes (Signed)
Pt c/o intermittent vaginal prolapse, ongoing for months; now sts "it's staying out" and causing her frequent urination
